# Patient Record
Sex: Female | Born: 1955 | Race: White | Hispanic: No | Marital: Married | Smoking: Former smoker
Health system: Southern US, Community
[De-identification: ages and names within clinical notes are randomized; demographics above are authoritative.]

## PROBLEM LIST (undated history)

## (undated) DIAGNOSIS — Z87891 Personal history of nicotine dependence: Secondary | ICD-10-CM

## (undated) DIAGNOSIS — B9681 Helicobacter pylori [H. pylori] as the cause of diseases classified elsewhere: Secondary | ICD-10-CM

## (undated) DIAGNOSIS — Z9889 Other specified postprocedural states: Secondary | ICD-10-CM

## (undated) DIAGNOSIS — L309 Dermatitis, unspecified: Secondary | ICD-10-CM

## (undated) DIAGNOSIS — J45909 Unspecified asthma, uncomplicated: Secondary | ICD-10-CM

## (undated) DIAGNOSIS — R911 Solitary pulmonary nodule: Secondary | ICD-10-CM

## (undated) DIAGNOSIS — J432 Centrilobular emphysema: Secondary | ICD-10-CM

## (undated) DIAGNOSIS — Z720 Tobacco use: Secondary | ICD-10-CM

## (undated) DIAGNOSIS — R112 Nausea with vomiting, unspecified: Secondary | ICD-10-CM

## (undated) DIAGNOSIS — C4490 Unspecified malignant neoplasm of skin, unspecified: Secondary | ICD-10-CM

## (undated) DIAGNOSIS — M8580 Other specified disorders of bone density and structure, unspecified site: Secondary | ICD-10-CM

## (undated) DIAGNOSIS — K219 Gastro-esophageal reflux disease without esophagitis: Secondary | ICD-10-CM

## (undated) DIAGNOSIS — Z85038 Personal history of other malignant neoplasm of large intestine: Secondary | ICD-10-CM

## (undated) DIAGNOSIS — C189 Malignant neoplasm of colon, unspecified: Secondary | ICD-10-CM

## (undated) DIAGNOSIS — T7840XA Allergy, unspecified, initial encounter: Secondary | ICD-10-CM

## (undated) DIAGNOSIS — B019 Varicella without complication: Secondary | ICD-10-CM

## (undated) HISTORY — DX: Allergy, unspecified, initial encounter: T78.40XA

## (undated) HISTORY — DX: Gastro-esophageal reflux disease without esophagitis: K21.9

## (undated) HISTORY — PX: WISDOM TOOTH EXTRACTION: SHX21

## (undated) HISTORY — DX: Varicella without complication: B01.9

## (undated) HISTORY — PX: INNER EAR SURGERY: SHX679

## (undated) HISTORY — PX: COLECTOMY: SHX59

## (undated) HISTORY — PX: ILEOSTOMY CLOSURE: SHX1784

## (undated) HISTORY — DX: Dermatitis, unspecified: L30.9

## (undated) HISTORY — DX: Malignant neoplasm of colon, unspecified: C18.9

## (undated) HISTORY — DX: Unspecified malignant neoplasm of skin, unspecified: C44.90

## (undated) HISTORY — DX: Other specified disorders of bone density and structure, unspecified site: M85.80

---

## 1998-04-02 HISTORY — PX: UPPER GASTROINTESTINAL ENDOSCOPY: SHX188

## 2006-06-17 ENCOUNTER — Encounter (INDEPENDENT_AMBULATORY_CARE_PROVIDER_SITE_OTHER): Payer: Self-pay | Admitting: Gynecology

## 2006-06-17 ENCOUNTER — Ambulatory Visit: Payer: Self-pay | Admitting: Gynecology

## 2006-09-30 ENCOUNTER — Emergency Department: Payer: Self-pay

## 2007-11-27 ENCOUNTER — Encounter (INDEPENDENT_AMBULATORY_CARE_PROVIDER_SITE_OTHER): Payer: Self-pay | Admitting: Gynecology

## 2007-11-27 ENCOUNTER — Ambulatory Visit: Payer: Self-pay | Admitting: Gynecology

## 2008-04-02 HISTORY — PX: COLONOSCOPY: SHX174

## 2008-12-28 ENCOUNTER — Ambulatory Visit: Payer: Self-pay | Admitting: Obstetrics & Gynecology

## 2008-12-28 ENCOUNTER — Encounter: Payer: Self-pay | Admitting: Obstetrics & Gynecology

## 2010-08-15 NOTE — Assessment & Plan Note (Signed)
NAME:  Crystal Barrett, Crystal Barrett NO.:  [id number]   MEDICAL RECORD NO.:  [id number]          PATIENT TYPE:  POB   LOCATION:  [facility] at [facility]         FACILITY:  [facility]   PHYSICIAN:  Allie Bossier, MD        DATE OF BIRTH:  1955-09-30   DATE OF SERVICE:  12/28/2008                                  CLINIC NOTE   Pansy is a 55 year old, married white, gravida 3, para 3, her children  are ages 59, 93, and 9.  She has 4 grandchildren and 1 on the way.  She  comes here today for her annual exam.  Her complaint today is that of  difficulty sleeping.  She says she takes her 2-3 hours fall asleep and  that she wakes up in the middle of the night.  She has tried Benadryl  and Tylenol PM, these both initially worked, but she grew tolerant of  their effects and now she cannot sleep very well at all.  Her other  complaint is that of a rash on her breast.  She has a history of eczema  and has been using her steroid cream on her breast, but the rash on her  breast has just gotten larger and larger.  She is not yet showing the  breast rash to her dermatologist.   PAST MEDICAL HISTORY:  Eczema and reflux.   PAST SURGICAL HISTORY:  She had her right ear drum replaced.  She has  had postpartum sterilization.   FAMILY HISTORY:  Negative for breast and colon cancer, but a maternal  aunt had ovarian cancer.   ALLERGIES:  CODEINE makes her nauseated.  Allergies to LATEX.   SOCIAL HISTORY:  She quit smoking in 2006, prior to that she has  approximately 25 pack-year history.  She drinks alcohol occasionally.  She drinks 2-3 cups of coffee every morning.   REVIEW OF SYSTEMS:  She is married.  She denies dyspareunia.  This is  her third marriage.  She has been married to this current husband for  last 2 years.  Her last mammogram was done in 2008.  She has not had a  colonoscopy as of yet.   PHYSICAL EXAMINATION:  VITAL SIGNS:  Weight 141, height 5 feet 6 inches,  blood pressure 123/83,  pulse 76.  HEENT:  Normal.  BREASTS:  Normal architecture of the breast.  The skin, however, shows  evidence of a probable tenia infection.  ABDOMEN:  Benign.  EXTERNAL GENITALIA:  No lesions, marked atrophy (the patient denies  dyspareunia).  Cervix nulliparous, no lesions.  Minimal discharge.  Uterus normal size and shape, midplane, nontender.  Adnexa nontender, no  masses.   ASSESSMENT AND PLAN:  Annual exam.  I have checked Pap smear, scheduled  a mammogram.  I am sending her back to her gastroenterologist, so that  she can get a colonoscopy scheduled.  With regard to her breast rash, I  feel this is fungal, and I have recommended either Nizoral shampoo or  Tinactin to her breast.  I have told her if these treatments do not  improve the rash, she goes to her dermatologist.  With regard to her  sleeping difficulty, I have  given her total 60 Ambien to be used as on as necessary basis.  She  understands that I will not refill this for another 12 months, this  gives her approximately 1 pill a week.      Allie Bossier, MD     MCD/MEDQ  D:  12/28/2008  T:  12/29/2008  Job:  [id number]

## 2010-08-15 NOTE — Assessment & Plan Note (Signed)
NAME:  Crystal Barrett, ECKMANN NO.:  [id number]   MEDICAL RECORD NO.:  [id number]          PATIENT TYPE:  POB   LOCATION:  [facility] at [facility]         FACILITY:  [facility]   PHYSICIAN:  Ginger Carne, MD DATE OF BIRTH:  Oct 28, 1955   DATE OF SERVICE:  11/27/2007                                  CLINIC NOTE   Ms. Crystal Barrett is here today for gynecologic evaluation.  Over the course of  a year, she stopped smoking approximately a year and a half ago.  She is  satisfied and continues with her hormone replacement therapy (estradiol  1.0/5 mg daily) and denies cardiac, GI, or GU complaints.  She has not  had any vaginal bleeding.  Her last mammogram was in April 2008 and will  have another one in April 2010.  Her past medical history is  unremarkable and the reader is referred to her chart for further  information.   PHYSICAL EXAMINATION:  VITAL SIGNS:  Salient physical findings, blood  pressure 122/83, weight 148 pounds, height 5 feet 6 inches, pulse 70.  HEENT:  Grossly normal.  BREASTS:  Without masses, discharge, thickenings, or tenderness.  CHEST:  Clear to percussion and auscultation.  CARDIOVASCULAR:  Without murmurs or enlargements.  Regular rate and  rhythm.  EXTREMITY, LYMPHATIC, SKIN, NEUROLOGICAL, AND MUSCULOSKELETAL:  Within  normal limits.  ABDOMEN:  Soft without gross hepatosplenomegaly.  PELVIC:  Pap smear performed.  EXTERNAL GENITALIA:  Vulva and vagina are normal.  Cervix smooth without  erosions or lesions.  Uterus small, anteverted, and flexed and both  adnexa palpable and found to be normal.   IMPRESSION:  Normal gynecologic examination.   PLAN:  The patient will have a mammogram in April 2010.  A complete  metabolic profile, TSH, CBC, and lipid panel were ordered and renewal on  estradiol 1 mg, norethindrone acetate 5 mg daily, and the patient was  asked to return in 1 year for routine followup evaluation.           ______________________________  Ginger Carne, MD     SHB/MEDQ  D:  11/27/2007  T:  11/27/2007  Job:  [id number]

## 2011-06-04 ENCOUNTER — Ambulatory Visit: Payer: Self-pay | Admitting: Gastroenterology

## 2014-01-25 ENCOUNTER — Encounter: Payer: Self-pay | Admitting: Podiatry

## 2014-01-25 ENCOUNTER — Ambulatory Visit (INDEPENDENT_AMBULATORY_CARE_PROVIDER_SITE_OTHER): Payer: BC Managed Care – PPO

## 2014-01-25 ENCOUNTER — Ambulatory Visit (INDEPENDENT_AMBULATORY_CARE_PROVIDER_SITE_OTHER): Payer: BC Managed Care – PPO | Admitting: Podiatry

## 2014-01-25 ENCOUNTER — Other Ambulatory Visit: Payer: Self-pay | Admitting: *Deleted

## 2014-01-25 VITALS — BP 110/75 | HR 77 | Resp 16 | Ht 66.0 in | Wt 130.0 lb

## 2014-01-25 DIAGNOSIS — S93402A Sprain of unspecified ligament of left ankle, initial encounter: Secondary | ICD-10-CM

## 2014-01-25 DIAGNOSIS — M76822 Posterior tibial tendinitis, left leg: Secondary | ICD-10-CM

## 2014-01-25 DIAGNOSIS — M722 Plantar fascial fibromatosis: Secondary | ICD-10-CM

## 2014-01-25 MED ORDER — METHYLPREDNISOLONE (PAK) 4 MG PO TABS: ORAL_TABLET | ORAL | Status: DC

## 2014-01-25 MED ORDER — MELOXICAM 15 MG PO TABS: 15.0000 mg | ORAL_TABLET | Freq: Every day | ORAL | Status: DC

## 2014-01-25 NOTE — Progress Notes (Signed)
   Subjective:    Patient ID: Crystal Barrett, female    DOB: Mar 10, 1956, 58 y.o.   MRN: [id number]  HPI Comments: Labor day week , walking on the beach and the left foot and ankle started to hurt, it bruised and swelled up. It has got better but the heel still hurts , the bottom of heel and around the sides up the back   Foot Pain      Review of Systems  All other systems reviewed and are negative.      Objective:   Physical Exam: I have reviewed her past medical history medications allergies surgery social history and review of systems. Pulses are strongly palpable bilateral. Neurologic sensorium is intact per Semmes-Weinstein monofilament. Deep tendon reflexes are intact bilateral and muscle strength +5 over 5 dorsiflexion plantar flexors inverters and everters on physical musculature is intact. She does have pain on palpation of the posterior tibial tendon as it courses beneath the medial malleolus of the left foot extending to the navicular tuberosity. She also has pain on palpation medial calcaneal tubercle of her left heel. Radiographic evaluation does not demonstrate a fracture to the calcaneus rather a soft tissue increase in density at the plantar fascial calcaneal insertion site indicative of plantar fasciitis.        Assessment & Plan:  Assessment: Plantar fasciitis with compensatory posterior tibial tendinitis left foot.  Plan: Discussed etiology pathology conservative versus surgical therapies. I injected the left heel today with Kenalog and local anesthetic. Prescribed a Medrol Dosepak to be followed by meloxicam. A plantar fascial brace and a night splint to be dispensed. Discussed appropriate shoe gear stretching exercises ice therapy shoe modifications. We discussed the etiology pathology conservative versus surgical therapies. I will follow-up with her in 1 month.

## 2014-02-22 ENCOUNTER — Ambulatory Visit: Payer: BC Managed Care – PPO | Admitting: Podiatry

## 2014-09-29 ENCOUNTER — Ambulatory Visit (INDEPENDENT_AMBULATORY_CARE_PROVIDER_SITE_OTHER): Payer: BLUE CROSS/BLUE SHIELD | Admitting: Obstetrics and Gynecology

## 2014-09-29 ENCOUNTER — Encounter: Payer: Self-pay | Admitting: Obstetrics and Gynecology

## 2014-09-29 VITALS — BP 104/70 | HR 67 | Ht 67.0 in | Wt 128.4 lb

## 2014-09-29 DIAGNOSIS — Z1211 Encounter for screening for malignant neoplasm of colon: Secondary | ICD-10-CM

## 2014-09-29 DIAGNOSIS — G4700 Insomnia, unspecified: Secondary | ICD-10-CM | POA: Insufficient documentation

## 2014-09-29 DIAGNOSIS — Z01419 Encounter for gynecological examination (general) (routine) without abnormal findings: Secondary | ICD-10-CM

## 2014-09-29 DIAGNOSIS — M8580 Other specified disorders of bone density and structure, unspecified site: Secondary | ICD-10-CM | POA: Diagnosis not present

## 2014-09-29 DIAGNOSIS — Z780 Asymptomatic menopausal state: Secondary | ICD-10-CM

## 2014-09-29 DIAGNOSIS — K219 Gastro-esophageal reflux disease without esophagitis: Secondary | ICD-10-CM | POA: Insufficient documentation

## 2014-09-29 DIAGNOSIS — Z1231 Encounter for screening mammogram for malignant neoplasm of breast: Secondary | ICD-10-CM

## 2014-09-29 MED ORDER — OMEPRAZOLE 40 MG PO CPDR: 40.0000 mg | DELAYED_RELEASE_CAPSULE | Freq: Every day | ORAL | Status: DC

## 2014-09-29 MED ORDER — TRAZODONE HCL 150 MG PO TABS: 150.0000 mg | ORAL_TABLET | Freq: Every day | ORAL | Status: DC

## 2014-09-29 NOTE — Progress Notes (Signed)
Patient ID: Crystal Barrett, female   DOB: 12/30/1955, 59 y.o.   MRN: [id number]   ANNUAL PREVENTATIVE CARE GYN  ENCOUNTER NOTE  Subjective:       Crystal MeekDorcas P Eppolito is a 59 y.o. [id number] female here for a routine annual gynecologic exam.  Current complaints: 1.  Establish care.  Patient is a 59 year old married white female, para 3003, menopausal, on no hormone replacement therapy, without vasomotor symptoms, who presents for establishment of prenatal care.  Patient was previously seen at [facility].  Past OB history: Para 3003. SVD 3. No prenatal complications.  Past GYN history: Menarche age 59. Menopause age 59. No history of HRT use. No history of vasomotor symptoms. No history of abnormal Pap smears; last Pap 2014-normal. No history of abnormal mammograms. Recent diagnosis of osteopenia; patient declines bisphosphonates and HRT.  She desires.  Calcium with vitamin D and exercise management.   Gynecologic History No LMP recorded. Patient is postmenopausal. Contraception: post menopausal status Last Pap: 05/02/2012 neg/neg. Results were: normal Last mammogram: 05/29/2013 . Results were: normal  Obstetric History OB History  Gravida Para Term Preterm AB SAB TAB Ectopic Multiple Living  3 3 3       3     # Outcome Date GA Lbr Len/2nd Weight Sex Delivery Anes PTL Lv  3 Term 1990   7 lb (3.175 kg) M VBAC   Y  2 Term 1977   7 lb 1.6 oz (3.221 kg) F VBAC   Y  1 Term 1975   7 lb (3.175 kg) F VBAC   Y      Past Medical History  Diagnosis Date  . Allergy   . Eczema   . Acid reflux   . Osteopenia     Past Surgical History  Procedure Laterality Date  . Inner ear surgery      Current Outpatient Prescriptions on File Prior to Visit  Medication Sig Dispense Refill  . Dermatological Products, Misc. (HPR PLUS) CREA     . fluticasone (FLONASE) 50 MCG/ACT nasal spray     . HALOG 0.1 % CREA     . omeprazole (PRILOSEC) 40 MG capsule     . PATANOL 0.1 % ophthalmic  solution     . traZODone (DESYREL) 150 MG tablet      No current facility-administered medications on file prior to visit.    Allergies  Allergen Reactions  . Codeine Nausea And Vomiting    History   Social History  . Marital Status: Married    Spouse Name: N/A  . Number of Children: N/A  . Years of Education: N/A   Occupational History  . Not on file.   Social History Main Topics  . Smoking status: Never Smoker   . Smokeless tobacco: Never Used  . Alcohol Use: Yes     Comment: occas  . Drug Use: No  . Sexual Activity: Yes    Birth Control/ Protection: None   Other Topics Concern  . Not on file   Social History Narrative    Family History  Problem Relation Age of Onset  . Heart disease Mother   . Breast cancer Neg Hx   . Colon cancer Neg Hx   . Ovarian cancer Maternal Aunt   . Diabetes Daughter     The following portions of the patient's history were reviewed and updated as appropriate: allergies, current medications, past family history, past medical history, past social history, past surgical history and problem list.  Review  of Systems ROS Review of Systems - General ROS: negative for - chills, fatigue, fever, hot flashes, night sweats, weight gain or weight loss Psychological ROS: negative for - anxiety, decreased libido, depression, mood swings, physical abuse or sexual abuse Ophthalmic ROS: negative for - blurry vision, eye pain or loss of vision ENT ROS: negative for - headaches, hearing change, visual changes or vocal changes Allergy and Immunology ROS: negative for - hives, itchy/watery eyes or seasonal allergies Hematological and Lymphatic ROS: negative for - bleeding problems, bruising, swollen lymph nodes or weight loss Endocrine ROS: negative for - galactorrhea, hair pattern changes, hot flashes, malaise/lethargy, mood swings, palpitations, polydipsia/polyuria, skin changes, temperature intolerance or unexpected weight changes Breast ROS: negative  for - new or changing breast lumps or nipple discharge Respiratory ROS: negative for - cough or shortness of breath Cardiovascular ROS: negative for - chest pain, irregular heartbeat, palpitations or shortness of breath Gastrointestinal ROS: no abdominal pain, change in bowel habits, or black or bloody stools Genito-Urinary ROS: no dysuria, trouble voiding, or hematuria Musculoskeletal ROS: negative for - joint pain or joint stiffness Neurological ROS: negative for - bowel and bladder control changes Dermatological ROS: negative for rash and skin lesion changes   Objective:   BP 104/70 mmHg  Pulse 67  Ht  (1.702 m)  Wt 128 lb 6.4 oz (58.242 kg)  BMI 20.11 kg/m2 CONSTITUTIONAL: Well-developed, well-nourished female in no acute distress.  PSYCHIATRIC: Normal mood and affect. Normal behavior. Normal judgment and thought content. NEUROLGIC: Alert and oriented to person, place, and time. Normal muscle tone coordination. No cranial nerve deficit noted. HENT:  Normocephalic, atraumatic, External right and left ear normal. Oropharynx is clear and moist EYES: Conjunctivae and EOM are normal. Pupils are equal, round, and reactive to light. No scleral icterus.  NECK: Normal range of motion, supple, no masses.  Normal thyroid.  SKIN: Skin is warm and dry. No rash noted. Not diaphoretic. No erythema. No pallor. CARDIOVASCULAR: Normal heart rate noted, regular rhythm, no murmur. RESPIRATORY: Clear to auscultation bilaterally. Effort and breath sounds normal, no problems with respiration noted. BREASTS: Symmetric in size. No masses, skin changes, nipple drainage, or lymphadenopathy. ABDOMEN: Soft, normal bowel sounds, no distention noted.  No tenderness, rebound or guarding.  BLADDER: Normal PELVIC:  External Genitalia: Normal  BUS: Normal  Vagina: Normal  Cervix: Normal  Uterus: Normal  Adnexa: Normal  RV: External Exam NormaI, No Rectal Masses and Normal Sphincter tone  MUSCULOSKELETAL:  Normal range of motion. No tenderness.  No cyanosis, clubbing, or edema.  2+ distal pulses. LYMPHATIC: No Axillary, Supraclavicular, or Inguinal Adenopathy.    Assessment:   Annual gynecologic examination 59 y.o. Contraception: post menopausal status Normal BMI Problem List Items Addressed This Visit    None      Plan:  Pap: Not needed Mammogram: Ordered Stool Guaiac Testing:  Ordered Labs: a1c,vit d, fbs,tsh and Lipid 1 Routine preventative health maintenance measures emphasized: Exercise/Diet/Weight control, Tobacco Warnings, Alcohol/Substance use risks and Stress Management Refill trazodone 150 mg daily at bedtime; refill omeprazole 40 mg daily Return to Clinic - [address] [place], CMA   Herold Harms, MD

## 2014-10-01 ENCOUNTER — Telehealth: Payer: Self-pay | Admitting: Obstetrics and Gynecology

## 2014-10-01 NOTE — Telephone Encounter (Signed)
Omeprazole, trazodone was phone to [facility]. Needs to be 90 day instead of 30 day

## 2014-10-08 ENCOUNTER — Other Ambulatory Visit: Payer: Self-pay

## 2014-10-08 MED ORDER — TRAZODONE HCL 150 MG PO TABS: 150.0000 mg | ORAL_TABLET | Freq: Every day | ORAL | Status: DC

## 2014-10-08 MED ORDER — OMEPRAZOLE 40 MG PO CPDR: 40.0000 mg | DELAYED_RELEASE_CAPSULE | Freq: Every day | ORAL | Status: DC

## 2014-10-08 NOTE — Telephone Encounter (Signed)
Rx for 90 day supply for Omeprazole and Trazodone. Pt aware

## 2014-10-11 NOTE — Telephone Encounter (Signed)
This was done.

## 2014-10-12 ENCOUNTER — Other Ambulatory Visit: Payer: BLUE CROSS/BLUE SHIELD

## 2014-10-13 LAB — LIPID PANEL
CHOL/HDL RATIO: 2.4 ratio (ref 0.0–4.4)
Cholesterol, Total: 164 mg/dL (ref 100–199)
HDL: 69 mg/dL (ref >39)
LDL Calculated: 83 mg/dL (ref 0–99)
TRIGLYCERIDES: 62 mg/dL (ref 0–149)
VLDL Cholesterol Cal: 12 mg/dL (ref 5–40)

## 2014-10-13 LAB — GLUCOSE, RANDOM: Glucose: 88 mg/dL (ref 65–99)

## 2014-10-13 LAB — TSH: TSH: 3.38 u[IU]/mL (ref 0.450–4.500)

## 2014-10-13 LAB — HEMOGLOBIN A1C
Est. average glucose Bld gHb Est-mCnc: 114 mg/dL
Hgb A1c MFr Bld: 5.6 % (ref 4.8–5.6)

## 2014-10-17 LAB — VITAMIN D 1,25 DIHYDROXY
Vitamin D 1, 25 (OH)2 Total: 36 pg/mL
Vitamin D3 1, 25 (OH)2: 36 pg/mL

## 2014-10-22 LAB — FECAL OCCULT BLOOD, IMMUNOCHEMICAL: Fecal Occult Bld: NEGATIVE

## 2015-01-31 ENCOUNTER — Encounter: Payer: Self-pay | Admitting: Family Medicine

## 2015-02-01 ENCOUNTER — Encounter: Payer: Self-pay | Admitting: Family Medicine

## 2015-02-21 ENCOUNTER — Ambulatory Visit: Payer: Self-pay | Admitting: Family Medicine

## 2015-04-06 ENCOUNTER — Encounter (INDEPENDENT_AMBULATORY_CARE_PROVIDER_SITE_OTHER): Payer: Self-pay

## 2015-04-06 ENCOUNTER — Encounter: Payer: Self-pay | Admitting: Internal Medicine

## 2015-04-06 ENCOUNTER — Ambulatory Visit (INDEPENDENT_AMBULATORY_CARE_PROVIDER_SITE_OTHER): Payer: BLUE CROSS/BLUE SHIELD | Admitting: Internal Medicine

## 2015-04-06 VITALS — BP 128/78 | HR 82 | Temp 98.1°F | Resp 12 | Ht 65.25 in | Wt 129.5 lb

## 2015-04-06 DIAGNOSIS — L309 Dermatitis, unspecified: Secondary | ICD-10-CM

## 2015-04-06 DIAGNOSIS — G4700 Insomnia, unspecified: Secondary | ICD-10-CM

## 2015-04-06 DIAGNOSIS — M8580 Other specified disorders of bone density and structure, unspecified site: Secondary | ICD-10-CM

## 2015-04-06 DIAGNOSIS — Z23 Encounter for immunization: Secondary | ICD-10-CM

## 2015-04-06 DIAGNOSIS — L03012 Cellulitis of left finger: Secondary | ICD-10-CM

## 2015-04-06 DIAGNOSIS — F5105 Insomnia due to other mental disorder: Secondary | ICD-10-CM

## 2015-04-06 DIAGNOSIS — J4520 Mild intermittent asthma, uncomplicated: Secondary | ICD-10-CM

## 2015-04-06 DIAGNOSIS — R0602 Shortness of breath: Secondary | ICD-10-CM

## 2015-04-06 DIAGNOSIS — J45909 Unspecified asthma, uncomplicated: Secondary | ICD-10-CM | POA: Insufficient documentation

## 2015-04-06 DIAGNOSIS — F419 Anxiety disorder, unspecified: Secondary | ICD-10-CM

## 2015-04-06 DIAGNOSIS — IMO0001 Reserved for inherently not codable concepts without codable children: Secondary | ICD-10-CM

## 2015-04-06 MED ORDER — CEPHALEXIN 500 MG PO CAPS: 500.0000 mg | ORAL_CAPSULE | Freq: Four times a day (QID) | ORAL | Status: DC

## 2015-04-06 MED ORDER — HYDROXYZINE HCL 25 MG PO TABS: 25.0000 mg | ORAL_TABLET | Freq: Three times a day (TID) | ORAL | Status: DC | PRN

## 2015-04-06 NOTE — Patient Instructions (Addendum)
Goal IS  1,000 IUS of D3 daily.  Ok to go up to 2, 000  Goal IS 1200 to 18000 mg calcium  Through diet and supplements, (max 1 supplement daily )  Your swollen cuticle looks like paronychia.  I am treateing your foe infection with cephalexin .  Take one capsule 3 times daily for 7 days  Continue to  Halog ointment but cover your hands with either saran wrap of cotton gloves to incresae the contact with sikin to all night long  Hydroxizine up to thee times daily for the itching   Wear gloves whenever possible  Chest x ray and pulmonary function test to evaluate you for COPD  You do not need appoinment for the chest  X ray .,  the office will set up the PFTS for you   Try reading an "old school book" at bedtime instead of the [place].  If the hydroxyzine doesn't help you sleep better,  E mail me for an alternative
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
## 2015-04-06 NOTE — Progress Notes (Signed)
Pre-visit discussion using our clinic review tool. No additional management support is needed unless otherwise documented below in the visit note.  

## 2015-04-09 ENCOUNTER — Encounter: Payer: Self-pay | Admitting: Internal Medicine

## 2015-04-09 DIAGNOSIS — L309 Dermatitis, unspecified: Secondary | ICD-10-CM | POA: Insufficient documentation

## 2015-04-09 NOTE — Assessment & Plan Note (Signed)
Recommended use of saran wrap vs gloves at night to increase period of contact with Halog.

## 2015-04-09 NOTE — Assessment & Plan Note (Signed)
Chronic,  Reviewed principles of good sleep hygiene. Trial of hydroxyzine.

## 2015-04-09 NOTE — Assessment & Plan Note (Addendum)
Moderate, by 2012 [facility]. Discussed the current controversies surrounding the risks and benefits of calcium supplementation.  Encouraged her to increase dietary calcium through natural foods including almond/coconut milk. Repeat DEXA will be discussed at next CPE.

## 2015-04-09 NOTE — Assessment & Plan Note (Signed)
Keflex prescribed.

## 2015-04-09 NOTE — Assessment & Plan Note (Signed)
She has a history of asthma since childhood,  But also smoked for several decades.  NO prior {PFTSs, no recent chest x ray  Need to rule out COPD and CAD

## 2015-04-12 ENCOUNTER — Ambulatory Visit
Admission: RE | Admit: 2015-04-12 | Discharge: 2015-04-12 | Disposition: A | Discharge status: Home / Self Care | Payer: BLUE CROSS/BLUE SHIELD | Source: Ambulatory Visit | Attending: Internal Medicine | Admitting: Internal Medicine

## 2015-04-12 DIAGNOSIS — J4520 Mild intermittent asthma, uncomplicated: Secondary | ICD-10-CM

## 2015-04-12 DIAGNOSIS — Z87891 Personal history of nicotine dependence: Secondary | ICD-10-CM | POA: Insufficient documentation

## 2015-04-12 DIAGNOSIS — R0602 Shortness of breath: Secondary | ICD-10-CM | POA: Diagnosis present

## 2015-04-12 DIAGNOSIS — J42 Unspecified chronic bronchitis: Secondary | ICD-10-CM | POA: Diagnosis not present

## 2015-04-19 ENCOUNTER — Other Ambulatory Visit: Payer: Self-pay | Admitting: Internal Medicine

## 2015-04-19 DIAGNOSIS — R0600 Dyspnea, unspecified: Secondary | ICD-10-CM

## 2015-04-20 ENCOUNTER — Ambulatory Visit (INDEPENDENT_AMBULATORY_CARE_PROVIDER_SITE_OTHER): Payer: BLUE CROSS/BLUE SHIELD | Admitting: *Deleted

## 2015-04-20 DIAGNOSIS — R0600 Dyspnea, unspecified: Secondary | ICD-10-CM | POA: Diagnosis not present

## 2015-04-20 LAB — PULMONARY FUNCTION TEST
DL/VA % pred: 78 %
DL/VA: 3.87 ml/min/mmHg/L
DLCO UNC: 31.47 ml/min/mmHg
DLCO unc % pred: 122 %
FEF 25-75 POST: 0.99 L/s
FEF 25-75 Pre: 1.23 L/sec
FEF2575-%CHANGE-POST: -19 %
FEF2575-%PRED-PRE: 50 %
FEF2575-%Pred-Post: 40 %
FEV1-%CHANGE-POST: -8 %
FEV1-%PRED-POST: 57 %
FEV1-%PRED-PRE: 62 %
FEV1-POST: 1.52 L
FEV1-PRE: 1.67 L
FEV1FVC-%CHANGE-POST: -10 %
FEV1FVC-%Pred-Pre: 64 %
FEV6-%Change-Post: 1 %
FEV6-%PRED-PRE: 100 %
FEV6-%Pred-Post: 101 %
FEV6-Post: 3.37 L
FEV6-Pre: 3.34 L
FEV6FVC-%Change-Post: 0 %
FEV6FVC-%Pred-Post: 102 %
FEV6FVC-%Pred-Pre: 103 %
FVC-%Change-Post: 1 %
FVC-%PRED-PRE: 96 %
FVC-%Pred-Post: 98 %
FVC-POST: 3.39 L
FVC-PRE: 3.34 L
POST FEV6/FVC RATIO: 100 %
PRE FEV1/FVC RATIO: 50 %
PRE FEV6/FVC RATIO: 100 %
Post FEV1/FVC ratio: 45 %

## 2015-04-20 NOTE — Progress Notes (Signed)
PFT performed today with a Nitrogen washout. 

## 2015-04-22 ENCOUNTER — Telehealth: Payer: Self-pay | Admitting: *Deleted

## 2015-04-22 ENCOUNTER — Telehealth: Payer: Self-pay | Admitting: Internal Medicine

## 2015-04-22 DIAGNOSIS — J432 Centrilobular emphysema: Secondary | ICD-10-CM

## 2015-04-22 DIAGNOSIS — J439 Emphysema, unspecified: Secondary | ICD-10-CM

## 2015-04-22 NOTE — Telephone Encounter (Signed)
Spoke with Lynden Ang at Dr. Darrick Huntsman office and gave results of PFT per KK. Pt has Moderate Obstructive Lung Disease with evidence of air trapping. Recommends: pulmonary referral. Informed we could give appt if needed. States she would call back once provider informed and wanted to do referral. Nothing further needed.

## 2015-04-22 NOTE — Telephone Encounter (Signed)
.  Referral is in process as requested to [facility] . plase notify apteitn tht her PFTS were abnormal and were diagnostic of COPD

## 2015-04-22 NOTE — Telephone Encounter (Signed)
Dr. Belia Heman stated there is evidence of obstructive lung disease and evidence of air trapping. Recommends Pulmonary referral. Call misty at this number with referral [phone]

## 2015-04-22 NOTE — Telephone Encounter (Signed)
Left message to return call to office.

## 2015-04-25 ENCOUNTER — Telehealth: Payer: Self-pay | Admitting: Internal Medicine

## 2015-04-25 MED ORDER — ZOLPIDEM TARTRATE 5 MG PO TABS: 5.0000 mg | ORAL_TABLET | Freq: Every evening | ORAL | Status: DC | PRN

## 2015-04-25 NOTE — Telephone Encounter (Signed)
Ok we can try generic ambien 5 mg daily rx printed

## 2015-04-25 NOTE — Telephone Encounter (Signed)
Is she taking the trazodone that is in her chart? That's a sleep medication

## 2015-04-25 NOTE — Telephone Encounter (Signed)
Pt states that yes she has been taking the trazadone but it not working, states that she has been on this medication for years and would like something stronger. Please advise

## 2015-04-25 NOTE — Telephone Encounter (Signed)
Please advise 

## 2015-04-25 NOTE — Telephone Encounter (Signed)
Patient notified and voiced understanding.

## 2015-04-25 NOTE — Telephone Encounter (Signed)
Patient called and wants to know if Dr. Darrick Huntsman can give her a rx to help her sleep. She states that the hydroxyzine hcl 25 doesn't help with sleep, only with her itching, plus it makes her stomach hurt. Please advise.

## 2015-05-06 ENCOUNTER — Ambulatory Visit (INDEPENDENT_AMBULATORY_CARE_PROVIDER_SITE_OTHER): Payer: BLUE CROSS/BLUE SHIELD | Admitting: Pulmonary Disease

## 2015-05-06 ENCOUNTER — Encounter: Payer: Self-pay | Admitting: Pulmonary Disease

## 2015-05-06 VITALS — BP 122/62 | HR 66 | Ht 66.0 in | Wt 130.4 lb

## 2015-05-06 DIAGNOSIS — J449 Chronic obstructive pulmonary disease, unspecified: Secondary | ICD-10-CM

## 2015-05-06 DIAGNOSIS — Z23 Encounter for immunization: Secondary | ICD-10-CM | POA: Diagnosis not present

## 2015-05-06 DIAGNOSIS — J45909 Unspecified asthma, uncomplicated: Secondary | ICD-10-CM

## 2015-05-06 MED ORDER — FLUTICASONE FUROATE-VILANTEROL 100-25 MCG/INH IN AEPB: 1.0000 | INHALATION_SPRAY | Freq: Every day | RESPIRATORY_TRACT | Status: AC

## 2015-05-06 MED ORDER — FLUTICASONE FUROATE-VILANTEROL 100-25 MCG/INH IN AEPB: 1.0000 | INHALATION_SPRAY | Freq: Every day | RESPIRATORY_TRACT | Status: DC

## 2015-05-08 NOTE — Progress Notes (Signed)
PULMONARY CONSULT NOTE  Requesting MD/Service: Darrick Huntsman, MD Date of initial consultation: 05/06/15 Reason for consultation: New dx of COPD  PT PROFILE: 60 y.o. F former smoker diagnosed with emphysema based on PFTs, CXR. Pt is minimally symptomatic on initial evaluation  HPI:  9 F who usually enjoys good health and remains active. She has a history of asthma as a child which she "outgrew" but which then relapsed at approx age 57. She quit smoking in 2008 and reports that her asthma again resolved. She indicates that she underwent a routine evaluation by Dr Darrick Huntsman who noted an abnormality on chest exam that was followed by CXR and PFTs, discussed below. She is referred for further evaluation. She exercises regularly on a treadmill. Initially, she indicated that her exercise tolerance is normal but on further questioning, admits that she is mildly limited by dyspnea. There is little to no day to day or seasonal variability. She denies chest pain, orthopnea, PND, cough, sputum production, hemoptysis, LE edema.    Past Medical History  Diagnosis Date  . Allergy   . Eczema   . Acid reflux   . Osteopenia   . Chicken pox     Past Surgical History  Procedure Laterality Date  . Inner ear surgery      MEDICATIONS: I have reviewed all medications and confirmed regimen as documented  Social History   Social History  . Marital Status: Married    Spouse Name: N/A  . Number of Children: N/A  . Years of Education: N/A   Occupational History  . Not on file.   Social History Main Topics  . Smoking status: Former Smoker -- 0.50 packs/day    Types: Cigarettes    Quit date: 07/05/2006  . Smokeless tobacco: Never Used  . Alcohol Use: 0.0 oz/week    0 Standard drinks or equivalent per week     Comment: occas  . Drug Use: No  . Sexual Activity: Yes    Birth Control/ Protection: None   Other Topics Concern  . Not on file   Social History Narrative    Family History  Problem Relation Age  of Onset  . Heart disease Mother   . Breast cancer Neg Hx   . Colon cancer Neg Hx   . Ovarian cancer Maternal Aunt   . Diabetes Daughter     ROS: No fever, myalgias/arthralgias, unexplained weight loss or weight gain No new focal weakness or sensory deficits No otalgia, hearing loss, visual changes, nasal and sinus symptoms, mouth and throat problems No neck pain or adenopathy No abdominal pain, N/V/D, diarrhea, change in bowel pattern No dysuria, change in urinary pattern No LE edema or calf tenderness   Filed Vitals:   05/06/15 1425  BP: 122/62  Pulse: 66  Height:  (1.676 m)  Weight: 130 lb 6.4 oz (59.149 kg)  SpO2: 95%     EXAM:  Gen: WDWN, No overt respiratory distress HEENT: NCAT, TMs and canals normal, oropharynx normal Neck: Supple without LAN, thyromegaly, JVD Lungs: breath sounds mildly diminished, percussion note normal, No wheezes Cardiovascular: Normal rate, reg rhythm, no murmurs noted Abdomen: Soft, nontender, normal BS Ext: without clubbing, cyanosis, edema Neuro: CNs grossly intact, motor and sensory intact, DTRs symmetric Skin: Limited exam, no lesions noted  DATA:   BMP Latest Ref Rng 10/12/2014  Glucose 65 - 99 mg/dL 88    No flowsheet data found.  CXR (04/12/15): [facility]  PFTs  (04/20/15): Mild to moderate obstruction (FEV1 62% pred),  no significant improvement with bronchodilator, normal lung volumes, normal DLCO  IMPRESSION:     ICD-9-CM ICD-10-CM   1. Asthma, unspecified asthma severity, uncomplicated 493.90 J45.909   2. Chronic obstructive pulmonary disease, unspecified COPD type (HCC) 496 J44.9   3. Need for prophylactic vaccination against Streptococcus pneumoniae (pneumococcus) V03.82 Z23 CANCELED: Pneumococcal polysaccharide vaccine 23-valent greater than or equal to 2yo subcutaneous/IM  1) H/O asthma 2) COPD - mild obstruction. Little evidence of reversibility.  3) Former smoker   PLAN:  1) we discussed the diagnosis of  COPD, its implications and expected course. I conveyed that she has already undertaken the most improtant aspects of management inculding smoking cessation, weight management and regular exercise 2) trial of Breo inhaler 3) Pneumovax administered on day of this encounter 4) ROV 6-8 wks to assess response to [facility]. If no symptomatic improvement, consider trial of Incruse.  5) She is a good candidate for LDCT screening and this will be discussed on future visits   Merwyn Katos, MD [facility], Critical Care Medicine

## 2015-06-27 ENCOUNTER — Ambulatory Visit: Payer: BLUE CROSS/BLUE SHIELD | Admitting: Pulmonary Disease

## 2015-07-01 ENCOUNTER — Other Ambulatory Visit: Payer: Self-pay | Admitting: Internal Medicine

## 2015-07-01 MED ORDER — FLUTICASONE PROPIONATE 50 MCG/ACT NA SUSP: 2.0000 | Freq: Every day | NASAL | Status: DC

## 2015-07-01 NOTE — Telephone Encounter (Signed)
Medication was a historical medication. Please advise?

## 2015-07-01 NOTE — Telephone Encounter (Signed)
REFILLED

## 2015-09-23 ENCOUNTER — Telehealth: Payer: Self-pay | Admitting: *Deleted

## 2015-09-23 DIAGNOSIS — Z1239 Encounter for other screening for malignant neoplasm of breast: Secondary | ICD-10-CM

## 2015-09-23 NOTE — Telephone Encounter (Signed)
Patient called and states that it is time for her mammogram and she wants to know if Dr. Tommi Rumpse can put the order in so she can get it done before her annual in Oct. . Patient is requesting a call back Her number is [phone]. Thanks

## 2015-09-23 NOTE — Telephone Encounter (Signed)
Pt aware mammo ordered. Given number to [place].

## 2015-10-05 ENCOUNTER — Ambulatory Visit: Payer: BLUE CROSS/BLUE SHIELD | Admitting: Internal Medicine

## 2015-10-11 ENCOUNTER — Encounter: Payer: BLUE CROSS/BLUE SHIELD | Admitting: Obstetrics and Gynecology

## 2015-11-03 ENCOUNTER — Other Ambulatory Visit: Payer: Self-pay | Admitting: *Deleted

## 2015-11-03 MED ORDER — FLUTICASONE FUROATE-VILANTEROL 100-25 MCG/INH IN AEPB: 1.0000 | INHALATION_SPRAY | Freq: Every day | RESPIRATORY_TRACT | 0 refills | Status: DC

## 2015-11-04 ENCOUNTER — Ambulatory Visit: Payer: BLUE CROSS/BLUE SHIELD | Admitting: Internal Medicine

## 2015-11-23 ENCOUNTER — Other Ambulatory Visit: Payer: Self-pay | Admitting: Obstetrics and Gynecology

## 2015-12-12 ENCOUNTER — Ambulatory Visit (INDEPENDENT_AMBULATORY_CARE_PROVIDER_SITE_OTHER): Payer: BLUE CROSS/BLUE SHIELD | Admitting: Internal Medicine

## 2015-12-12 ENCOUNTER — Encounter: Payer: Self-pay | Admitting: Internal Medicine

## 2015-12-12 DIAGNOSIS — L309 Dermatitis, unspecified: Secondary | ICD-10-CM | POA: Diagnosis not present

## 2015-12-12 MED ORDER — COAL TAR EXTRACT 2.5 % EX EMUL: CUTANEOUS | 3 refills | Status: DC

## 2015-12-12 NOTE — Patient Instructions (Addendum)
I think the diagnosis of eczema is correct but the regimen should be changed;  1) Use Neutrogena  [place] Formula Emulsion hand cream three times daily  2) Soak heel in tar oil called Balnetar for 15 mintues,  Then apply ceravie/clobetasol to heel and wrap the heel in saran wrap for the night   3) Apply the ceravie two more times during the day

## 2015-12-12 NOTE — Progress Notes (Signed)
Subjective:  Patient ID: Crystal Barrett, female    DOB: [birth year]  Age: 60 y.o. MRN: [id number]  CC: The encounter diagnosis was Eczema.  HPIroDorcas P Bodine presents for persistent eczema of left heel.  Has been treating it since February with no improvement.  Has tried 3 creams , heel feels irritated and skin peels.  Using mineral oil   Most recent cream prescribed for the heel as Ceravie mixed with clobetasol 0.05% solution and has been using it on body but it has not healed the cracked and peeling skin  of her  heel.  Previous triial of halcinonide  Didn't work either  .  Has been wearing open healed shoes, keepig foot dry.     Outpatient Medications Prior to Visit  Medication Sig Dispense Refill  . Dermatological Products, Misc. (HPR PLUS) CREA     . fluticasone furoate-vilanterol (BREO ELLIPTA) 100-25 MCG/INH AEPB Inhale 1 puff into the lungs daily. MUST BE SEEN BEFORE ANY FURTHER REFILLS. 180 each 0  . HALOG 0.1 % CREA     . loteprednol (LOTEMAX) 0.5 % ophthalmic suspension Place 1 drop into both eyes 2 (two) times daily.    Marland Kitchen omeprazole (PRILOSEC) 40 MG capsule TAKE 1 CAPSULE DAILY 60 capsule 1  . traZODone (DESYREL) 150 MG tablet TAKE 1 TABLET AT BEDTIME 60 tablet 0  . fluticasone (FLONASE) 50 MCG/ACT nasal spray Place 2 sprays into both nostrils daily. (Patient not taking: Reported on 12/12/2015) 16 g 3  . zolpidem (AMBIEN) 5 MG tablet Take 1 tablet (5 mg total) by mouth at bedtime as needed for sleep. (Patient not taking: Reported on 12/12/2015) 30 tablet 0  . cephALEXin (KEFLEX) 500 MG capsule Take 1 capsule (500 mg total) by mouth 4 (four) times daily. 21 capsule 0  . hydrOXYzine (ATARAX/VISTARIL) 25 MG tablet Take 1 tablet (25 mg total) by mouth 3 (three) times daily as needed. 90 tablet 0   No facility-administered medications prior to visit.     Review of Systems;  Patient denies headache, fevers, malaise, unintentional weight loss, skin rash, eye pain, sinus  congestion and sinus pain, sore throat, dysphagia,  hemoptysis , cough, dyspnea, wheezing, chest pain, palpitations, orthopnea, edema, abdominal pain, nausea, melena, diarrhea, constipation, flank pain, dysuria, hematuria, urinary  Frequency, nocturia, numbness, tingling, seizures,  Focal weakness, Loss of consciousness,  Tremor, insomnia, depression, anxiety, and suicidal ideation.      Objective:  BP 122/84 (BP Location: Right Arm, Patient Position: Sitting, Cuff Size: Normal)   Pulse (!) 150   Temp 97 F (36.1 C) (Oral)   Resp 16   Ht 5\' 5"  (1.651 m)   Wt 136 lb (61.7 kg)   SpO2 97%   BMI 22.63 kg/m   BP Readings from Last 3 Encounters:  12/12/15 122/84  05/06/15 122/62  04/06/15 128/78    Wt Readings from Last 3 Encounters:  12/12/15 136 lb (61.7 kg)  05/06/15 130 lb 6.4 oz (59.1 kg)  04/06/15 129 lb 8 oz (58.7 kg)    General appearance: alert, cooperative and appears stated age Ears: normal TM's and external ear canals both ears Throat: lips, mucosa, and tongue normal; teeth and gums normal Neck: no adenopathy, no carotid bruit, supple, symmetrical, trachea midline and thyroid not enlarged, symmetric, no tenderness/mass/nodules Back: symmetric, no curvature. ROM normal. No CVA tenderness. Lungs: clear to auscultation bilaterally Heart: regular rate and rhythm, S1, S2 normal, no murmur, click, rub or gallop Abdomen: soft, non-tender; bowel sounds normal; no  masses,  no organomegaly Pulses: 2+ and symmetric Skin: left heel with peeling crackiing skin and eczematous rash  Lymph nodes: Cervical, supraclavicular, and axillary nodes normal.  Lab Results  Component Value Date   HGBA1C 5.6 10/12/2014    No results found for: CREATININE  Lab Results  Component Value Date   GLUCOSE 88 10/12/2014   CHOL 164 10/12/2014   TRIG 62 10/12/2014   HDL 69 10/12/2014   LDLCALC 83 10/12/2014   TSH 3.380 10/12/2014   HGBA1C 5.6 10/12/2014    Dg Chest 2 View  Result Date:  04/12/2015 CLINICAL DATA:  URI symptoms; possible chronic bronchitis, known history of asthma and previous tobacco use; abnormal chest exam. EXAM: CHEST  2 VIEW COMPARISON:  None in PACs FINDINGS: The lungs are well-expanded. There is no hemidiaphragm flattening. The heart and pulmonary vascularity are normal. The mediastinum is normal in width. There is no pleural effusion. There is mild anterior wedging of a mid thoracic vertebral body. IMPRESSION: Mild hyperinflation is consistent with acute and/or chronic bronchitis-asthma. There is no alveolar pneumonia nor CHF. Electronically Signed   By: David  SwazilandJordan M.D.   On: 04/12/2015 08:21    Assessment & Plan:   Problem List Items Addressed This Visit    Eczema    Patient has seen Dermatology three times with no improvement in eczema.  New treatment plan involves application of emulsifier  tar soaks,  And steroid  cream       Other Visit Diagnoses   None.     I have discontinued Ms. Mohamad's hydrOXYzine and cephALEXin. I am also having her start on [employer] Sachs[employer]al Tar Extract. Additionally, I am having her maintain her HPR PLUS, HALOG, loteprednol, zolpidem, fluticasone, fluticasone furoate-vilanterol, traZODone, and omeprazole.  Meds ordered this encounter  Medications  . Coal Tar Extract 2.5 % EMUL    Sig: Use nightly to siak heel for 15 minutes    Dispense:  221 mL    Refill:  3    Medications Discontinued During This Encounter  Medication Reason  . cephALEXin (KEFLEX) 500 MG capsule One time medication  . hydrOXYzine (ATARAX/VISTARIL) 25 MG tablet Patient has not taken in last 30 days    Follow-up: No Follow-up on file.   Sherlene ShamsULLO, Cable Fearn L, MD

## 2015-12-13 NOTE — Progress Notes (Signed)
Pre visit review using our clinic review tool, if applicable. No additional management support is needed unless otherwise documented below in the visit note. 

## 2015-12-14 NOTE — Assessment & Plan Note (Signed)
Patient has seen Dermatology three times with no improvement in eczema.  New treatment plan involves application of emulsifier  tar soaks,  And steroid  cream

## 2016-01-09 NOTE — Progress Notes (Signed)
ANNUAL PREVENTATIVE CARE GYN  ENCOUNTER NOTE  Subjective:       Crystal Barrett is a 60 y.o. [id number] female here for a routine annual gynecologic exam.  Current complaints: 1.  Vaginal dryness with dyspareunia  Patient is not interested in systemic hormone replacement therapy. She is willing to go on a trial of vaginal estrogen.  Bowel and bladder function are normal.  Mammogram has already been completed with results pending.   Gynecologic History No LMP recorded. Patient is postmenopausal. Contraception: post menopausal status Last [facility]: [facility] Results were: normal Last mammogram:  01/10/2016- waiting on results. Results were:  Menarche age 60. Menopause age 60. No history of HRT use. No history of vasomotor symptoms. No history of abnormal Pap smears; last Pap 2014-normal. No history of abnormal mammograms. Recent diagnosis of osteopenia; patient declines bisphosphonates and HRT.  She desires.  Calcium with vitamin D and exercise management.    Obstetric History OB History  Gravida Para Term Preterm AB Living  3 3 3     3   SAB TAB Ectopic Multiple Live Births          3    # Outcome Date GA Lbr Len/2nd Weight Sex Delivery Anes PTL Lv  3 Term 1990   7 lb (3.175 kg) M VBAC   LIV  2 Term 1977   7 lb 1.6 oz (3.221 kg) F VBAC   LIV  1 Term 1975   7 lb (3.175 kg) F VBAC   LIV      Past Medical History:  Diagnosis Date  . Acid reflux   . Allergy   . Chicken pox   . Eczema   . Osteopenia     Past Surgical History:  Procedure Laterality Date  . INNER EAR SURGERY      Current Outpatient Prescriptions on File Prior to Visit  Medication Sig Dispense Refill  . Coal Tar Extract 2.5 % EMUL Use nightly to siak heel for 15 minutes 221 mL 3  . Dermatological Products, Misc. (HPR PLUS) CREA     . fluticasone (FLONASE) 50 MCG/ACT nasal spray Place 2 sprays into both nostrils daily. (Patient not taking: Reported on 12/12/2015) 16 g 3  . fluticasone  furoate-vilanterol (BREO ELLIPTA) 100-25 MCG/INH AEPB Inhale 1 puff into the lungs daily. MUST BE SEEN BEFORE ANY FURTHER REFILLS. 180 each 0  . HALOG 0.1 % CREA     . loteprednol (LOTEMAX) 0.5 % ophthalmic suspension Place 1 drop into both eyes 2 (two) times daily.    Marland Kitchen. omeprazole (PRILOSEC) 40 MG capsule TAKE 1 CAPSULE DAILY 60 capsule 1  . traZODone (DESYREL) 150 MG tablet TAKE 1 TABLET AT BEDTIME 60 tablet 0  . zolpidem (AMBIEN) 5 MG tablet Take 1 tablet (5 mg total) by mouth at bedtime as needed for sleep. (Patient not taking: Reported on 12/12/2015) 30 tablet 0   No current facility-administered medications on file prior to visit.     Allergies  Allergen Reactions  . Codeine Nausea And Vomiting    Social History   Social History  . Marital status: Married    Spouse name: N/A  . Number of children: N/A  . Years of education: N/A   Occupational History  . Not on file.   Social History Main Topics  . Smoking status: Former Smoker    Packs/day: 0.50    Types: Cigarettes    Quit date: 07/05/2006  . Smokeless tobacco: Never Used  . Alcohol  use 0.0 oz/week     Comment: occas  . Drug use: No  . Sexual activity: Yes    Birth control/ protection: None   Other Topics Concern  . Not on file   Social History Narrative  . No narrative on file    Family History  Problem Relation Age of Onset  . Heart disease Mother   . Ovarian cancer Maternal Aunt   . Diabetes Daughter   . Breast cancer Neg Hx   . Colon cancer Neg Hx     The following portions of the patient's history were reviewed and updated as appropriate: allergies, current medications, past family history, past medical history, past social history, past surgical history and problem list.  Review of Systems ROS Review of Systems - General ROS: negative for - chills, fatigue, fever, hot flashes, night sweats, weight gain or weight loss Psychological ROS: negative for - anxiety, decreased libido, depression, mood  swings, physical abuse or sexual abuse Ophthalmic ROS: negative for - blurry vision, eye pain or loss of vision ENT ROS: negative for - headaches, hearing change, visual changes or vocal changes Allergy and Immunology ROS: negative for - hives, itchy/watery eyes or seasonal allergies Hematological and Lymphatic ROS: negative for - bleeding problems, bruising, swollen lymph nodes or weight loss Endocrine ROS: negative for - galactorrhea, hair pattern changes, hot flashes, malaise/lethargy, mood swings, palpitations, polydipsia/polyuria, skin changes, temperature intolerance or unexpected weight changes Breast ROS: negative for - new or changing breast lumps or nipple discharge Respiratory ROS: negative for - cough or shortness of breath Cardiovascular ROS: negative for - chest pain, irregular heartbeat, palpitations or shortness of breath Gastrointestinal ROS: no abdominal pain, change in bowel habits, or black or bloody stools Genito-Urinary ROS: no dysuria, trouble voiding, or hematuria Musculoskeletal ROS: negative for - joint pain or joint stiffness Neurological ROS: negative for - bowel and bladder control changes Dermatological ROS: negative for rash and skin lesion changes   Objective:   BP 99/68   Pulse 80   Ht 5\' 5"  (1.651 m)   Wt 136 lb 3.2 oz (61.8 kg)   BMI 22.66 kg/m  CONSTITUTIONAL: Well-developed, well-nourished female in no acute distress.  PSYCHIATRIC: Normal mood and affect. Normal behavior. Normal judgment and thought content. NEUROLGIC: Alert and oriented to person, place, and time. Normal muscle tone coordination. No cranial nerve deficit noted. HENT:  Normocephalic, atraumatic, External right and left ear normal. Oropharynx is clear and moist EYES: Conjunctivae and EOM are normal. Pupils are equal, round, and reactive to light. No scleral icterus.  NECK: Normal range of motion, supple, no masses.  Normal thyroid.  SKIN: Skin is warm and dry. No rash noted. Not  diaphoretic. No erythema. No pallor. CARDIOVASCULAR: Normal heart rate noted, regular rhythm, no murmur. RESPIRATORY: Clear to auscultation bilaterally. Effort and breath sounds normal, no problems with respiration noted. BREASTS: Symmetric in size. No masses, skin changes, nipple drainage, or lymphadenopathy. ABDOMEN: Soft, normal bowel sounds, no distention noted.  No tenderness, rebound or guarding.  BLADDER: Normal PELVIC:  External Genitalia: Atrophic changes  BUS: Small urethral caruncle  Vagina: Moderate vaginal atrophy  Cervix: Normal; no lesions  Uterus: Normal; small midplane mobile and nontender  Adnexa: Normal; nonpalpable and nontender  RV: External Exam NormaI, No Rectal Masses and Normal Sphincter tone  MUSCULOSKELETAL: Normal range of motion. No tenderness.  No cyanosis, clubbing, or edema.  2+ distal pulses. LYMPHATIC: No Axillary, Supraclavicular, or Inguinal Adenopathy.    Assessment:   Annual gynecologic  examination 60 y.o. Contraception: post menopausal status Normal BMI Problem List Items Addressed This Visit    Menopause   Osteopenia    Other Visit Diagnoses    Well woman exam with routine gynecological exam    -  Primary   Encounter for screening mammogram for breast cancer       Screening for colon cancer          Plan:  Pap: Pap Co Test Mammogram: utd done- 01/10/2016 Stool Guaiac Testing:  Ordered Labs: Will check with insurance and call back if desires Routine preventative health maintenance measures emphasized: Exercise/Diet/Weight control, Tobacco Warnings and Alcohol/Substance use risks Trial of Premarin cream intravaginal twice a week Return to Clinic - 1 Year   Brandi Armato [place], CMA  Herold Harms, MD  Note: This dictation was prepared with Dragon dictation along with smaller phrase technology. Any transcriptional errors that result from this process are unintentional.

## 2016-01-10 ENCOUNTER — Encounter: Payer: Self-pay | Admitting: Obstetrics and Gynecology

## 2016-01-10 ENCOUNTER — Ambulatory Visit (INDEPENDENT_AMBULATORY_CARE_PROVIDER_SITE_OTHER): Payer: BLUE CROSS/BLUE SHIELD | Admitting: Obstetrics and Gynecology

## 2016-01-10 ENCOUNTER — Ambulatory Visit
Admission: RE | Admit: 2016-01-10 | Discharge: 2016-01-10 | Disposition: A | Discharge status: Home / Self Care | Payer: BLUE CROSS/BLUE SHIELD | Source: Ambulatory Visit | Attending: Obstetrics and Gynecology | Admitting: Obstetrics and Gynecology

## 2016-01-10 VITALS — BP 99/68 | HR 80 | Ht 65.0 in | Wt 136.2 lb

## 2016-01-10 DIAGNOSIS — Z23 Encounter for immunization: Secondary | ICD-10-CM | POA: Diagnosis not present

## 2016-01-10 DIAGNOSIS — N952 Postmenopausal atrophic vaginitis: Secondary | ICD-10-CM

## 2016-01-10 DIAGNOSIS — Z780 Asymptomatic menopausal state: Secondary | ICD-10-CM | POA: Diagnosis not present

## 2016-01-10 DIAGNOSIS — Z01419 Encounter for gynecological examination (general) (routine) without abnormal findings: Secondary | ICD-10-CM

## 2016-01-10 DIAGNOSIS — M8580 Other specified disorders of bone density and structure, unspecified site: Secondary | ICD-10-CM

## 2016-01-10 DIAGNOSIS — Z1231 Encounter for screening mammogram for malignant neoplasm of breast: Secondary | ICD-10-CM | POA: Insufficient documentation

## 2016-01-10 DIAGNOSIS — Z1239 Encounter for other screening for malignant neoplasm of breast: Secondary | ICD-10-CM

## 2016-01-10 DIAGNOSIS — Z1211 Encounter for screening for malignant neoplasm of colon: Secondary | ICD-10-CM | POA: Diagnosis not present

## 2016-01-10 MED ORDER — TRAZODONE HCL 150 MG PO TABS: 150.0000 mg | ORAL_TABLET | Freq: Every day | ORAL | 0 refills | Status: DC

## 2016-01-10 MED ORDER — OMEPRAZOLE 40 MG PO CPDR: 40.0000 mg | DELAYED_RELEASE_CAPSULE | Freq: Every day | ORAL | 3 refills | Status: DC

## 2016-01-10 NOTE — Patient Instructions (Addendum)
1. Pap smear is done 2. Mammogram is already completed 3. Stool guaiac cards are given 4. Screening labs may be drawn later patient's request once she checks with insurance carrier 5. Continue with healthy eating and exercise 6. Recommend calcium with vitamin D supplementation daily.  7. Return in 1 year  Influenza (Flu) Vaccine (Inactivated or Recombinant):  1. Why get vaccinated? Influenza ("flu") is a contagious disease that spreads around the [place] every year, usually between October and May. Flu is caused by influenza viruses, and is spread mainly by coughing, sneezing, and close contact. Anyone can get flu. Flu strikes suddenly and can last several days. Symptoms vary by age, but can include:  fever/chills  sore throat  muscle aches  fatigue  cough  headache  runny or stuffy nose Flu can also lead to pneumonia and blood infections, and cause diarrhea and seizures in children. If you have a medical condition, such as heart or lung disease, flu can make it worse. Flu is more dangerous for some people. Infants and young children, people 60 years of age and older, pregnant women, and people with certain health conditions or a weakened immune system are at greatest risk. Each year thousands of people in the [place] die from flu, and many more are hospitalized. Flu vaccine can:  keep you from getting flu,  make flu less severe if you do get it, and  keep you from spreading flu to your family and other people. 2. Inactivated and recombinant flu vaccines A dose of flu vaccine is recommended every flu season. Children 6 months through 518 years of age may need two doses during the same flu season. Everyone else needs only one dose each flu season. Some inactivated flu vaccines contain a very small amount of a mercury-based preservative called thimerosal. Studies have not shown thimerosal in vaccines to be harmful, but flu vaccines that do not contain thimerosal are  available. There is no live flu virus in flu shots. They cannot cause the flu. There are many flu viruses, and they are always changing. Each year a new flu vaccine is made to protect against three or four viruses that are likely to cause disease in the upcoming flu season. But even when the vaccine doesn't exactly match these viruses, it may still provide some protection. Flu vaccine cannot prevent:  flu that is caused by a virus not covered by the vaccine, or  illnesses that look like flu but are not. It takes about 2 weeks for protection to develop after vaccination, and protection lasts through the flu season. 3. Some people should not get this vaccine Tell the person who is giving you the vaccine:  If you have any severe, life-threatening allergies. If you ever had a life-threatening allergic reaction after a dose of flu vaccine, or have a severe allergy to any part of this vaccine, you may be advised not to get vaccinated. Most, but not all, types of flu vaccine contain a small amount of egg protein.  If you ever had Guillain-Barre Syndrome (also called GBS). Some people with a history of GBS should not get this vaccine. This should be discussed with your doctor.  If you are not feeling well. It is usually okay to get flu vaccine when you have a mild illness, but you might be asked to come back when you feel better. 4. Risks of a vaccine reaction With any medicine, including vaccines, there is a chance of reactions. These are usually mild and  go away on their own, but serious reactions are also possible. Most people who get a flu shot do not have any problems with it. Minor problems following a flu shot include:  soreness, redness, or swelling where the shot was given  hoarseness  sore, red or itchy eyes  cough  fever  aches  headache  itching  fatigue If these problems occur, they usually begin soon after the shot and last 1 or 2 days. More serious problems following a  flu shot can include the following:  There may be a small increased risk of Guillain-Barre Syndrome (GBS) after inactivated flu vaccine. This risk has been estimated at 1 or 2 additional cases per million people vaccinated. This is much lower than the risk of severe complications from flu, which can be prevented by flu vaccine.  Young children who get the flu shot along with pneumococcal vaccine (PCV13) and/or DTaP vaccine at the same time might be slightly more likely to have a seizure caused by fever. Ask your doctor for more information. Tell your doctor if a child who is getting flu vaccine has ever had a seizure. Problems that could happen after any injected vaccine:  People sometimes faint after a medical procedure, including vaccination. Sitting or lying down for about 15 minutes can help prevent fainting, and injuries caused by a fall. Tell your doctor if you feel dizzy, or have vision changes or ringing in the ears.  Some people get severe pain in the shoulder and have difficulty moving the arm where a shot was given. This happens very rarely.  Any medication can cause a severe allergic reaction. Such reactions from a vaccine are very rare, estimated at about 1 in a million doses, and would happen within a few minutes to a few hours after the vaccination. As with any medicine, there is a very remote chance of a vaccine causing a serious injury or death. The safety of vaccines is always being monitored. For more information, visit: http://floyd.org/ 5. What if there is a serious reaction? What should I look for?  Look for anything that concerns you, such as signs of a severe allergic reaction, very high fever, or unusual behavior. Signs of a severe allergic reaction can include hives, swelling of the face and throat, difficulty breathing, a fast heartbeat, dizziness, and weakness. These would start a few minutes to a few hours after the vaccination. What should I do?  If you  think it is a severe allergic reaction or other emergency that can't wait, call 9-1-1 and get the person to the nearest hospital. Otherwise, call your doctor.  Reactions should be reported to the Vaccine Adverse Event Reporting System (VAERS). Your doctor should file this report, or you can do it yourself through the VAERS web site at www.vaers.LAgents.no, or by calling 1-(872)103-7821. VAERS does not give medical advice. 6. The National Vaccine Injury Compensation Program The [employer] Vaccine Injury Compensation Program (VICP) is a federal program that was created to compensate people who may have been injured by certain vaccines. Persons who believe they may have been injured by a vaccine can learn about the program and about filing a claim by calling 1-708 556 4814 or visiting the VICP website at SpiritualWord.at. There is a time limit to file a claim for compensation. 7. How can I learn more?  Ask your healthcare provider. He or she can give you the vaccine package insert or suggest other sources of information.  Call your local or state health department.  Contact  the Centers for Disease Control and Prevention (CDC):  Call 541-644-0812 (1-800-CDC-INFO) or  Visit CDC's website at BiotechRoom.com.cy Vaccine Information Statement Inactivated Influenza Vaccine (11/06/2013)   This information is not intended to replace advice given to you by your health care provider. Make sure you discuss any questions you have with your health care provider.   Document Released: 01/11/2006 Document Revised: 04/09/2014 Document Reviewed: 11/09/2013 Elsevier Interactive Patient Education [employer]

## 2016-01-10 NOTE — Addendum Note (Signed)
Addended by: Marchelle FolksMILLER, Merrissa Giacobbe G on: 01/10/2016 03:52 PM   Modules accepted: Orders

## 2016-01-13 LAB — PAP IG AND HPV HIGH-RISK
HPV, high-risk: NEGATIVE
PAP SMEAR COMMENT: 0

## 2016-01-16 ENCOUNTER — Telehealth: Payer: Self-pay | Admitting: Obstetrics and Gynecology

## 2016-01-16 MED ORDER — ESTROGENS, CONJUGATED 0.625 MG/GM VA CREA: 0.2500 | TOPICAL_CREAM | VAGINAL | 12 refills | Status: DC

## 2016-01-16 NOTE — Telephone Encounter (Signed)
Pt aware med [facility]

## 2016-01-16 NOTE — Telephone Encounter (Signed)
PT CALLED AND SHE WAS HERE LAST Tuesday THE 10TH, AND SHE WAS GIVEN PRIMERIN CREAM AND IS REQUESTING A RX TO BE CALLED IN THE THE [facility] [place].

## 2016-01-30 ENCOUNTER — Telehealth: Payer: Self-pay | Admitting: Obstetrics and Gynecology

## 2016-01-30 NOTE — Telephone Encounter (Signed)
Pt is interested in a trial of HRT. Aware mad will get this message on Tuedsay 10/31.

## 2016-01-30 NOTE — Telephone Encounter (Signed)
PT CALLED AND SHE SAW DR DE AND KNOW WANTED TO TRY THE MEDICATION THAT HE RECOMMENDED FOR HOT FLASHES, TO HELP HER SLEEP AT NIGHT.

## 2016-02-02 MED ORDER — CONJ ESTROG-MEDROXYPROGEST ACE 0.625-2.5 MG PO TABS: 1.0000 | ORAL_TABLET | Freq: Every day | ORAL | 1 refills | Status: DC

## 2016-02-02 NOTE — Telephone Encounter (Signed)
Vm full- per mad ok to erx prempro 0.625/2.5. Will try to contact pt again.

## 2016-02-02 NOTE — Telephone Encounter (Signed)
Pt aware. 6 week f/u appt made for 12/14.

## 2016-03-03 LAB — FECAL OCCULT BLOOD, IMMUNOCHEMICAL: Fecal Occult Bld: NEGATIVE

## 2016-03-15 ENCOUNTER — Encounter: Payer: BLUE CROSS/BLUE SHIELD | Admitting: Obstetrics and Gynecology

## 2016-03-20 ENCOUNTER — Encounter: Payer: BLUE CROSS/BLUE SHIELD | Admitting: Obstetrics and Gynecology

## 2016-04-26 ENCOUNTER — Other Ambulatory Visit: Payer: Self-pay | Admitting: Obstetrics and Gynecology

## 2016-07-16 ENCOUNTER — Other Ambulatory Visit: Payer: Self-pay | Admitting: Internal Medicine

## 2016-07-17 NOTE — Telephone Encounter (Signed)
Not in pt's med list. Is it ok to refill?

## 2016-07-17 NOTE — Telephone Encounter (Signed)
refilled 

## 2016-07-25 ENCOUNTER — Other Ambulatory Visit: Payer: Self-pay | Admitting: Obstetrics and Gynecology

## 2016-09-05 ENCOUNTER — Telehealth: Payer: Self-pay | Admitting: Internal Medicine

## 2016-09-05 MED ORDER — PREDNISONE 10 MG PO TABS: ORAL_TABLET | ORAL | 0 refills | Status: DC

## 2016-09-05 MED ORDER — BENZONATATE 200 MG PO CAPS: 200.0000 mg | ORAL_CAPSULE | Freq: Two times a day (BID) | ORAL | 0 refills | Status: DC | PRN

## 2016-09-05 NOTE — Telephone Encounter (Signed)
Please advise 

## 2016-09-05 NOTE — Telephone Encounter (Signed)
Prednisone taper and tessalon perles sent to [facility]

## 2016-09-05 NOTE — Telephone Encounter (Signed)
She has had a dry cough since last Thursday. She has tried nyquill and Delsyne cough syrup with no improvement. She does has not had or have fever or body aches. She states that it hurts when she coughs. She is allergic to codeine. Can a cough syrup be sent to [facility] in [place]? Pt cb [phone]

## 2016-09-05 NOTE — Telephone Encounter (Signed)
Patient notified

## 2016-10-23 ENCOUNTER — Other Ambulatory Visit: Payer: Self-pay | Admitting: Obstetrics and Gynecology

## 2016-10-23 MED ORDER — TRAZODONE HCL 150 MG PO TABS: 150.0000 mg | ORAL_TABLET | Freq: Every day | ORAL | 0 refills | Status: DC

## 2016-11-27 ENCOUNTER — Other Ambulatory Visit: Payer: Self-pay | Admitting: Obstetrics and Gynecology

## 2017-01-15 ENCOUNTER — Encounter: Payer: BLUE CROSS/BLUE SHIELD | Admitting: Obstetrics and Gynecology

## 2017-01-18 ENCOUNTER — Other Ambulatory Visit: Payer: Self-pay | Admitting: Internal Medicine

## 2017-01-18 DIAGNOSIS — Z1231 Encounter for screening mammogram for malignant neoplasm of breast: Secondary | ICD-10-CM

## 2017-01-21 ENCOUNTER — Ambulatory Visit (INDEPENDENT_AMBULATORY_CARE_PROVIDER_SITE_OTHER): Payer: BLUE CROSS/BLUE SHIELD

## 2017-01-21 DIAGNOSIS — Z23 Encounter for immunization: Secondary | ICD-10-CM

## 2017-01-31 ENCOUNTER — Other Ambulatory Visit: Payer: Self-pay | Admitting: Pulmonary Disease

## 2017-01-31 ENCOUNTER — Other Ambulatory Visit: Payer: Self-pay | Admitting: Obstetrics and Gynecology

## 2017-02-04 NOTE — Progress Notes (Signed)
ANNUAL PREVENTATIVE CARE GYN  ENCOUNTER NOTE  Subjective:       LEEANNE Barrett is a 61 y.o. [id number] female here for a routine annual gynecologic exam.  Current complaints: 1.  None  Patient is currently taking Prempro 2.5 mg daily.  Not experiencing any vasomotor symptoms.  She also is using Premarin cream intravaginally once a week.  She is rarely having sexual intercourse and is not symptomatic. Patient is on methotrexate now for eczema and symptoms appear to be under control.  She is taking extra folic acid as a supplement.  Bowel and bladder function are normal.  Mammogram has already been completed with results pending.   Gynecologic History No LMP recorded. Patient is postmenopausal. Contraception: post menopausal status Last Pap: 01/2016 neg/neg . Results were: normal Last mammogram:  01/10/2016- [facility]. Results were:  Menarche age 61. Menopause age 47. No history of HRT use. No history of vasomotor symptoms. No history of abnormal Pap smears; last Pap 2014-normal. No history of abnormal mammograms. Recent diagnosis of osteopenia; patient declines bisphosphonates and HRT.  She desires.  Calcium with vitamin D and exercise management.    Obstetric History OB History  Gravida Para Term Preterm AB Living  3 3 3     3   SAB TAB Ectopic Multiple Live Births          3    # Outcome Date GA Lbr Len/2nd Weight Sex Delivery Anes PTL Lv  3 Term 1990   6 lb 16 oz (3.175 kg) M VBAC   LIV  2 Term 1977   7 lb 1.6 oz (3.221 kg) F VBAC   LIV  1 Term 1975   6 lb 16 oz (3.175 kg) F VBAC   LIV      Past Medical History:  Diagnosis Date  . Acid reflux   . Allergy   . Chicken pox   . Eczema   . Osteopenia     Past Surgical History:  Procedure Laterality Date  . INNER EAR SURGERY      Current Outpatient Medications on File Prior to Visit  Medication Sig Dispense Refill  . benzonatate (TESSALON) 200 MG capsule Take 1 capsule (200 mg total) by mouth 2 (two) times daily as  needed for cough. 20 capsule 0  . BREO ELLIPTA 100-25 MCG/INH AEPB USE 1 INHALATION DAILY (MUST BE SEEN BEFORE ANY FURTHER REFILLS) 10800 each 0  . Coal Tar Extract 2.5 % EMUL Use nightly to siak heel for 15 minutes 221 mL 3  . conjugated estrogens (PREMARIN) vaginal cream Place 0.25 Applicatorfuls vaginally 2 (two) times a week. 42.5 g 12  . Dermatological Products, Misc. (HPR PLUS) CREA     . estrogen, conjugated,-medroxyprogesterone (PREMPRO) 0.625-2.5 MG tablet Take 1 tablet by mouth daily. 90 tablet 1  . fluticasone (FLONASE) 50 MCG/ACT nasal spray USE 2 SPRAYS IN EACH NOSTRIL DAILY 16 g 3  . HALOG 0.1 % CREA     . loteprednol (LOTEMAX) 0.5 % ophthalmic suspension Place 1 drop into both eyes 2 (two) times daily.    Marland Kitchen omeprazole (PRILOSEC) 40 MG capsule Take 1 capsule (40 mg total) by mouth daily. 90 capsule 3  . predniSONE (DELTASONE) 10 MG tablet 6 tablets on Day 1 , then reduce by 1 tablet daily until gone 21 tablet 0  . traZODone (DESYREL) 150 MG tablet TAKE 1 TABLET AT BEDTIME 60 tablet 0   No current facility-administered medications on file prior to visit.  Allergies  Allergen Reactions  . Codeine Nausea And Vomiting    Social History   Socioeconomic History  . Marital status: Married    Spouse name: Not on file  . Number of children: Not on file  . Years of education: Not on file  . Highest education level: Not on file  Social Needs  . Financial resource strain: Not on file  . Food insecurity - worry: Not on file  . Food insecurity - inability: Not on file  . Transportation needs - medical: Not on file  . Transportation needs - non-medical: Not on file  Occupational History  . Not on file  Tobacco Use  . Smoking status: Former Smoker    Packs/day: 0.50    Types: Cigarettes    Last attempt to quit: 07/05/2006    Years since quitting: 10.5  . Smokeless tobacco: Never Used  Substance and Sexual Activity  . Alcohol use: Yes    Alcohol/week: 0.0 oz    Comment:  occas  . Drug use: No  . Sexual activity: Yes    Birth control/protection: Post-menopausal  Other Topics Concern  . Not on file  Social History Narrative  . Not on file    Family History  Problem Relation Age of Onset  . Heart disease Mother   . Ovarian cancer Maternal Aunt   . Diabetes Daughter   . Breast cancer Neg Hx   . Colon cancer Neg Hx     The following portions of the patient's history were reviewed and updated as appropriate: allergies, current medications, past family history, past medical history, past social history, past surgical history and problem list.  Review of Systems Review of Systems  Constitutional: Negative.   HENT: Negative.   Eyes: Negative.   Cardiovascular: Negative.   Gastrointestinal: Negative.   Genitourinary: Negative.   Musculoskeletal: Negative.   Skin: Negative.        Eczema is under control  Neurological: Negative.   Endo/Heme/Allergies: Negative.   Psychiatric/Behavioral: Negative.      Objective:   BP 102/67   Pulse 78   Ht 5\' 5"  (1.651 m)   Wt 134 lb 12.8 oz (61.1 kg)   BMI 22.43 kg/m  CONSTITUTIONAL: Well-developed, well-nourished female in no acute distress.  PSYCHIATRIC: Normal mood and affect. Normal behavior. Normal judgment and thought content. NEUROLGIC: Alert and oriented to person, place, and time. Normal muscle tone coordination. No cranial nerve deficit noted. HENT:  Normocephalic, atraumatic, External right and left ear normal. Oropharynx is clear and moist EYES: Conjunctivae and EOM are normal.  No scleral icterus.  NECK: Normal range of motion, supple, no masses.  Normal thyroid.  SKIN: Skin is warm and dry. No rash noted. Not diaphoretic. No erythema. No pallor. CARDIOVASCULAR: Normal heart rate noted, regular rhythm, no murmur. RESPIRATORY: Clear to auscultation bilaterally. Effort and breath sounds normal, no problems with respiration noted. BREASTS: Symmetric in size. No masses, skin changes, nipple  drainage, or lymphadenopathy. ABDOMEN: Soft, normal bowel sounds, no distention noted.  No tenderness, rebound or guarding.  BLADDER: Normal PELVIC:  External Genitalia: Atrophic changes  BUS: Mild atrophic changes  Vagina: Mild vaginal atrophy  Cervix: Normal; no lesions  Uterus: Normal; small midplane mobile and nontender  Adnexa: Normal; nonpalpable and nontender  RV: Thin rectal sphincter, External Exam NormaI, No Rectal Masses and Normal Sphincter tone  MUSCULOSKELETAL: Normal range of motion. No tenderness.  No cyanosis, clubbing, or edema.  2+ distal pulses. LYMPHATIC: No Axillary, Supraclavicular, or Inguinal  Adenopathy.    Assessment:   Annual gynecologic examination 61 y.o. Contraception: post menopausal status Normal BMI Problem List Items Addressed This Visit    Menopause   Osteopenia   Vaginal atrophy    Other Visit Diagnoses    Well woman exam with routine gynecological exam    -  Primary   Screening for colon cancer       Screening for breast cancer        Well-tolerated HRT; patient wishes to continue  Plan:  Pap: Due 2020 Mammogram:scheduled for 02/06/2017 Stool Guaiac Testing:  Ordered Labs:declines Routine preventative health maintenance measures emphasized: Exercise/Diet/Weight control, Tobacco Warnings and Alcohol/Substance use risks Premarin cream intravaginal twice a week Return to Clinic - 1 Year   [employer], CMA  Herold Harms, MD  Note: This dictation was prepared with Dragon dictation along with smaller phrase technology. Any transcriptional errors that result from this process are unintentional.

## 2017-02-06 ENCOUNTER — Encounter: Payer: Self-pay | Admitting: Obstetrics and Gynecology

## 2017-02-06 ENCOUNTER — Ambulatory Visit
Admission: RE | Admit: 2017-02-06 | Discharge: 2017-02-06 | Disposition: A | Discharge status: Home / Self Care | Payer: BLUE CROSS/BLUE SHIELD | Source: Ambulatory Visit | Attending: Internal Medicine | Admitting: Internal Medicine

## 2017-02-06 ENCOUNTER — Ambulatory Visit (INDEPENDENT_AMBULATORY_CARE_PROVIDER_SITE_OTHER): Payer: BLUE CROSS/BLUE SHIELD | Admitting: Obstetrics and Gynecology

## 2017-02-06 VITALS — BP 102/67 | HR 78 | Ht 65.0 in | Wt 134.8 lb

## 2017-02-06 DIAGNOSIS — M8580 Other specified disorders of bone density and structure, unspecified site: Secondary | ICD-10-CM

## 2017-02-06 DIAGNOSIS — Z01419 Encounter for gynecological examination (general) (routine) without abnormal findings: Secondary | ICD-10-CM | POA: Diagnosis not present

## 2017-02-06 DIAGNOSIS — N952 Postmenopausal atrophic vaginitis: Secondary | ICD-10-CM

## 2017-02-06 DIAGNOSIS — Z780 Asymptomatic menopausal state: Secondary | ICD-10-CM | POA: Diagnosis not present

## 2017-02-06 DIAGNOSIS — Z1211 Encounter for screening for malignant neoplasm of colon: Secondary | ICD-10-CM

## 2017-02-06 DIAGNOSIS — Z1239 Encounter for other screening for malignant neoplasm of breast: Secondary | ICD-10-CM

## 2017-02-06 DIAGNOSIS — Z1231 Encounter for screening mammogram for malignant neoplasm of breast: Secondary | ICD-10-CM

## 2017-02-06 NOTE — Patient Instructions (Signed)
1.  No Pap smear done.  Next Pap is due in 2020 2.  Mammogram is ordered 3.  Stool guaiac cards are given for colon cancer screening 4.  Continue with healthy eating and exercise. 5.  Continue with calcium and vitamin D supplementation 6.  Continue with Prempro 0.625/2.5 mg daily 7.  Continue with Premarin cream intravaginal once or twice a week 8.  Return in 1 year for annual exam   Health Maintenance for Postmenopausal Women Menopause is a normal process in which your reproductive ability comes to an end. This process happens gradually over a span of months to years, usually between the ages of 39 and 47. Menopause is complete when you have missed 12 consecutive menstrual periods. It is important to talk with your health care provider about some of the most common conditions that affect postmenopausal women, such as heart disease, cancer, and bone loss (osteoporosis). Adopting a healthy lifestyle and getting preventive care can help to promote your health and wellness. Those actions can also lower your chances of developing some of these common conditions. What should I know about menopause? During menopause, you may experience a number of symptoms, such as:  Moderate-to-severe hot flashes.  Night sweats.  Decrease in sex drive.  Mood swings.  Headaches.  Tiredness.  Irritability.  Memory problems.  Insomnia.  Choosing to treat or not to treat menopausal changes is an individual decision that you make with your health care provider. What should I know about hormone replacement therapy and supplements? Hormone therapy products are effective for treating symptoms that are associated with menopause, such as hot flashes and night sweats. Hormone replacement carries certain risks, especially as you become older. If you are thinking about using estrogen or estrogen with progestin treatments, discuss the benefits and risks with your health care provider. What should I know about heart  disease and stroke? Heart disease, heart attack, and stroke become more likely as you age. This may be due, in part, to the hormonal changes that your body experiences during menopause. These can affect how your body processes dietary fats, triglycerides, and cholesterol. Heart attack and stroke are both medical emergencies. There are many things that you can do to help prevent heart disease and stroke:  Have your blood pressure checked at least every 1-2 years. High blood pressure causes heart disease and increases the risk of stroke.  If you are 51-23 years old, ask your health care provider if you should take aspirin to prevent a heart attack or a stroke.  Do not use any tobacco products, including cigarettes, chewing tobacco, or electronic cigarettes. If you need help quitting, ask your health care provider.  It is important to eat a healthy diet and maintain a healthy weight. ? Be sure to include plenty of vegetables, fruits, low-fat dairy products, and lean protein. ? Avoid eating foods that are high in solid fats, added sugars, or salt (sodium).  Get regular exercise. This is one of the most important things that you can do for your health. ? Try to exercise for at least 150 minutes each week. The type of exercise that you do should increase your heart rate and make you sweat. This is known as moderate-intensity exercise. ? Try to do strengthening exercises at least twice each week. Do these in addition to the moderate-intensity exercise.  Know your numbers.Ask your health care provider to check your cholesterol and your blood glucose. Continue to have your blood tested as directed by your health  care provider.  What should I know about cancer screening? There are several types of cancer. Take the following steps to reduce your risk and to catch any cancer development as early as possible. Breast Cancer  Practice breast self-awareness. ? This means understanding how your breasts  normally appear and feel. ? It also means doing regular breast self-exams. Let your health care provider know about any changes, no matter how small.  If you are 53 or older, have a clinician do a breast exam (clinical breast exam or CBE) every year. Depending on your age, family history, and medical history, it may be recommended that you also have a yearly breast X-ray (mammogram).  If you have a family history of breast cancer, talk with your health care provider about genetic screening.  If you are at high risk for breast cancer, talk with your health care provider about having an MRI and a mammogram every year.  Breast cancer (BRCA) gene test is recommended for women who have family members with BRCA-related cancers. Results of the assessment will determine the need for genetic counseling and BRCA1 and for BRCA2 testing. BRCA-related cancers include these types: ? Breast. This occurs in males or females. ? Ovarian. ? Tubal. This may also be called fallopian tube cancer. ? Cancer of the abdominal or pelvic lining (peritoneal cancer). ? Prostate. ? Pancreatic.  Cervical, Uterine, and Ovarian Cancer Your health care provider may recommend that you be screened regularly for cancer of the pelvic organs. These include your ovaries, uterus, and vagina. This screening involves a pelvic exam, which includes checking for microscopic changes to the surface of your cervix (Pap test).  For women ages 21-65, health care providers may recommend a pelvic exam and a Pap test every three years. For women ages 22-65, they may recommend the Pap test and pelvic exam, combined with testing for human papilloma virus (HPV), every five years. Some types of HPV increase your risk of cervical cancer. Testing for HPV may also be done on women of any age who have unclear Pap test results.  Other health care providers may not recommend any screening for nonpregnant women who are considered low risk for pelvic cancer  and have no symptoms. Ask your health care provider if a screening pelvic exam is right for you.  If you have had past treatment for cervical cancer or a condition that could lead to cancer, you need Pap tests and screening for cancer for at least 20 years after your treatment. If Pap tests have been discontinued for you, your risk factors (such as having a new sexual partner) need to be reassessed to determine if you should start having screenings again. Some women have medical problems that increase the chance of getting cervical cancer. In these cases, your health care provider may recommend that you have screening and Pap tests more often.  If you have a family history of uterine cancer or ovarian cancer, talk with your health care provider about genetic screening.  If you have vaginal bleeding after reaching menopause, tell your health care provider.  There are currently no reliable tests available to screen for ovarian cancer.  Lung Cancer Lung cancer screening is recommended for adults 40-64 years old who are at high risk for lung cancer because of a history of smoking. A yearly low-dose CT scan of the lungs is recommended if you:  Currently smoke.  Have a history of at least 30 pack-years of smoking and you currently smoke or have quit  within the past 15 years. A pack-year is smoking an average of one pack of cigarettes per day for one year.  Yearly screening should:  Continue until it has been 15 years since you quit.  Stop if you develop a health problem that would prevent you from having lung cancer treatment.  Colorectal Cancer  This type of cancer can be detected and can often be prevented.  Routine colorectal cancer screening usually begins at age 48 and continues through age 73.  If you have risk factors for colon cancer, your health care provider may recommend that you be screened at an earlier age.  If you have a family history of colorectal cancer, talk with your  health care provider about genetic screening.  Your health care provider may also recommend using home test kits to check for hidden blood in your stool.  A small camera at the end of a tube can be used to examine your colon directly (sigmoidoscopy or colonoscopy). This is done to check for the earliest forms of colorectal cancer.  Direct examination of the colon should be repeated every 5-10 years until age 50. However, if early forms of precancerous polyps or small growths are found or if you have a family history or genetic risk for colorectal cancer, you may need to be screened more often.  Skin Cancer  Check your skin from head to toe regularly.  Monitor any moles. Be sure to tell your health care provider: ? About any new moles or changes in moles, especially if there is a change in a mole's shape or color. ? If you have a mole that is larger than the size of a pencil eraser.  If any of your family members has a history of skin cancer, especially at a young age, talk with your health care provider about genetic screening.  Always use sunscreen. Apply sunscreen liberally and repeatedly throughout the day.  Whenever you are outside, protect yourself by wearing long sleeves, pants, a wide-brimmed hat, and sunglasses.  What should I know about osteoporosis? Osteoporosis is a condition in which bone destruction happens more quickly than new bone creation. After menopause, you may be at an increased risk for osteoporosis. To help prevent osteoporosis or the bone fractures that can happen because of osteoporosis, the following is recommended:  If you are 39-82 years old, get at least 1,000 mg of calcium and at least 600 mg of vitamin D per day.  If you are older than age 34 but younger than age 43, get at least 1,200 mg of calcium and at least 600 mg of vitamin D per day.  If you are older than age 45, get at least 1,200 mg of calcium and at least 800 mg of vitamin D per day.  Smoking  and excessive alcohol intake increase the risk of osteoporosis. Eat foods that are rich in calcium and vitamin D, and do weight-bearing exercises several times each week as directed by your health care provider. What should I know about how menopause affects my mental health? Depression may occur at any age, but it is more common as you become older. Common symptoms of depression include:  Low or sad mood.  Changes in sleep patterns.  Changes in appetite or eating patterns.  Feeling an overall lack of motivation or enjoyment of activities that you previously enjoyed.  Frequent crying spells.  Talk with your health care provider if you think that you are experiencing depression. What should I know about immunizations?  It is important that you get and maintain your immunizations. These include:  Tetanus, diphtheria, and pertussis (Tdap) booster vaccine.  Influenza every year before the flu season begins.  Pneumonia vaccine.  Shingles vaccine.  Your health care provider may also recommend other immunizations. This information is not intended to replace advice given to you by your health care provider. Make sure you discuss any questions you have with your health care provider. Document Released: 05/11/2005 Document Revised: 10/07/2015 Document Reviewed: 12/21/2014 Elsevier Interactive Patient Education  2018 [employer].

## 2017-02-10 DIAGNOSIS — Z1211 Encounter for screening for malignant neoplasm of colon: Secondary | ICD-10-CM | POA: Diagnosis not present

## 2017-02-11 ENCOUNTER — Other Ambulatory Visit: Payer: Self-pay | Admitting: Obstetrics and Gynecology

## 2017-02-19 LAB — FECAL OCCULT BLOOD, IMMUNOCHEMICAL: Fecal Occult Bld: NEGATIVE

## 2017-02-26 ENCOUNTER — Telehealth: Payer: Self-pay | Admitting: Obstetrics and Gynecology

## 2017-02-26 NOTE — Telephone Encounter (Signed)
The patient called and stated that she would like a all back from [facility] to to get one of her prescriptions refilled, I believe she said  conjugated estrogens (PREMARIN) vaginal cream on the vm. The patient also stated that her preferred pharmacy is [facility] in [place]. No other information was disclosed. Please advise.

## 2017-02-27 MED ORDER — CONJ ESTROG-MEDROXYPROGEST ACE 0.625-2.5 MG PO TABS
1.0000 | ORAL_TABLET | Freq: Every day | ORAL | 0 refills | Status: DC
Start: 2017-02-27 — End: 2017-02-27

## 2017-02-27 MED ORDER — CONJ ESTROG-MEDROXYPROGEST ACE 0.625-2.5 MG PO TABS: 1.0000 | ORAL_TABLET | Freq: Every day | ORAL | 3 refills | Status: DC

## 2017-02-27 NOTE — Telephone Encounter (Signed)
Pt needs refill of prempro 2.5. 30 d supply to [facility] in [place] and 90 day to [facility].

## 2017-04-01 ENCOUNTER — Other Ambulatory Visit: Payer: Self-pay | Admitting: Obstetrics and Gynecology

## 2017-04-23 DIAGNOSIS — R21 Rash and other nonspecific skin eruption: Secondary | ICD-10-CM | POA: Diagnosis not present

## 2017-04-23 DIAGNOSIS — Z79899 Other long term (current) drug therapy: Secondary | ICD-10-CM | POA: Diagnosis not present

## 2017-04-29 DIAGNOSIS — Z79899 Other long term (current) drug therapy: Secondary | ICD-10-CM | POA: Diagnosis not present

## 2017-06-06 ENCOUNTER — Telehealth: Payer: Self-pay | Admitting: *Deleted

## 2017-06-06 ENCOUNTER — Ambulatory Visit: Payer: Self-pay | Admitting: *Deleted

## 2017-06-06 ENCOUNTER — Ambulatory Visit: Payer: BLUE CROSS/BLUE SHIELD | Admitting: Family Medicine

## 2017-06-06 ENCOUNTER — Other Ambulatory Visit: Payer: Self-pay | Admitting: Internal Medicine

## 2017-06-06 NOTE — Telephone Encounter (Signed)
Called pt regarding medication request; nurse triage initiated; pt states that her cough statrted on 06/02/17 and has been worsening daily; she also reports itchy eyes with discharge, and feels tired; recommendations made per protocol to include seeing a physician within 3 days; pt offered and accepted appointment today with Delbert HarnessJulie Kordsmeier at 1445; pt verbalizes understanding.  Reason for Disposition . [1] Patient also has allergy symptoms (e.g., itchy eyes, clear nasal discharge, postnasal drip) AND [2] they are acting up  Answer Assessment - Initial Assessment Questions 1. ONSET: "When did the cough begin?"      Sunday 06/02/17 2. SEVERITY: "How bad is the cough today?"      Dry cough all morning 3. RESPIRATORY DISTRESS: "Describe your breathing."      ok 4. FEVER: "Do you have a fever?" If so, ask: "What is your temperature, how was it measured, and when did it start?"     No 5. HEMOPTYSIS: "Are you coughing up any blood?" If so ask: "How much?" (flecks, streaks, tablespoons, etc.)     no 6. TREATMENT: "What have you done so far to treat the cough?" (e.g., meds, fluids, humidifier)     12  hr mucinex cold, cough, sinus; has used breo inhaler 7. CARDIAC HISTORY: "Do you have any history of heart disease?" (e.g., heart attack, congestive heart failure)     no 8. LUNG HISTORY: "Do you have any history of lung disease?"  (e.g., pulmonary embolus, asthma, emphysema)     asthma 9. PE RISK FACTORS: "Do you have a history of blood clots?" (or: recent major surgery, recent prolonged travel, bedridden )     no 10. OTHER SYMPTOMS: "Do you have any other symptoms? (e.g., runny nose, wheezing, chest pain)       Itchy eyes with drainage; tired 11. PREGNANCY: "Is there any chance you are pregnant?" "When was your last menstrual period?"       no 12. TRAVEL: "Have you traveled out of the country in the last month?" (e.g., travel history, exposures)       no  Protocols used: COUGH - ACUTE  NON-PRODUCTIVE-A-AH

## 2017-06-06 NOTE — Telephone Encounter (Signed)
Copied from CRM [phone]. Topic: Quick Communication - Appointment Cancellation >> Jun 06, 2017 12:50 PM [name], Brittany L wrote: Patient called to cancel appointment scheduled for today Patient has not rescheduled their appointment.    Route to department's PEC pool.

## 2017-06-07 NOTE — Telephone Encounter (Signed)
Refilled: 07/17/2016 Last OV: 12/12/2015 Next OV: not scheduled

## 2017-06-07 NOTE — Telephone Encounter (Signed)
She will be billed the $50 charge for cancelling same day

## 2017-06-07 NOTE — Telephone Encounter (Signed)
Will you please charge this pt for cancelling the same day per Dr. Darrick Huntsman.

## 2017-06-07 NOTE — Telephone Encounter (Signed)
fyi

## 2017-06-29 ENCOUNTER — Other Ambulatory Visit: Payer: Self-pay | Admitting: Obstetrics and Gynecology

## 2017-08-02 IMAGING — CR DG CHEST 2V
1 series · 2 of 2 positions shown · non-contrast
Comparison: None in PACs

CLINICAL DATA: URI symptoms; possible chronic bronchitis, known
history of asthma and previous tobacco use; abnormal chest exam.

EXAM:
CHEST  2 VIEW

[Series 1: dg chest 2 view · 0.14mm/px · 2 of 2 slices shown]
[im 1/2]
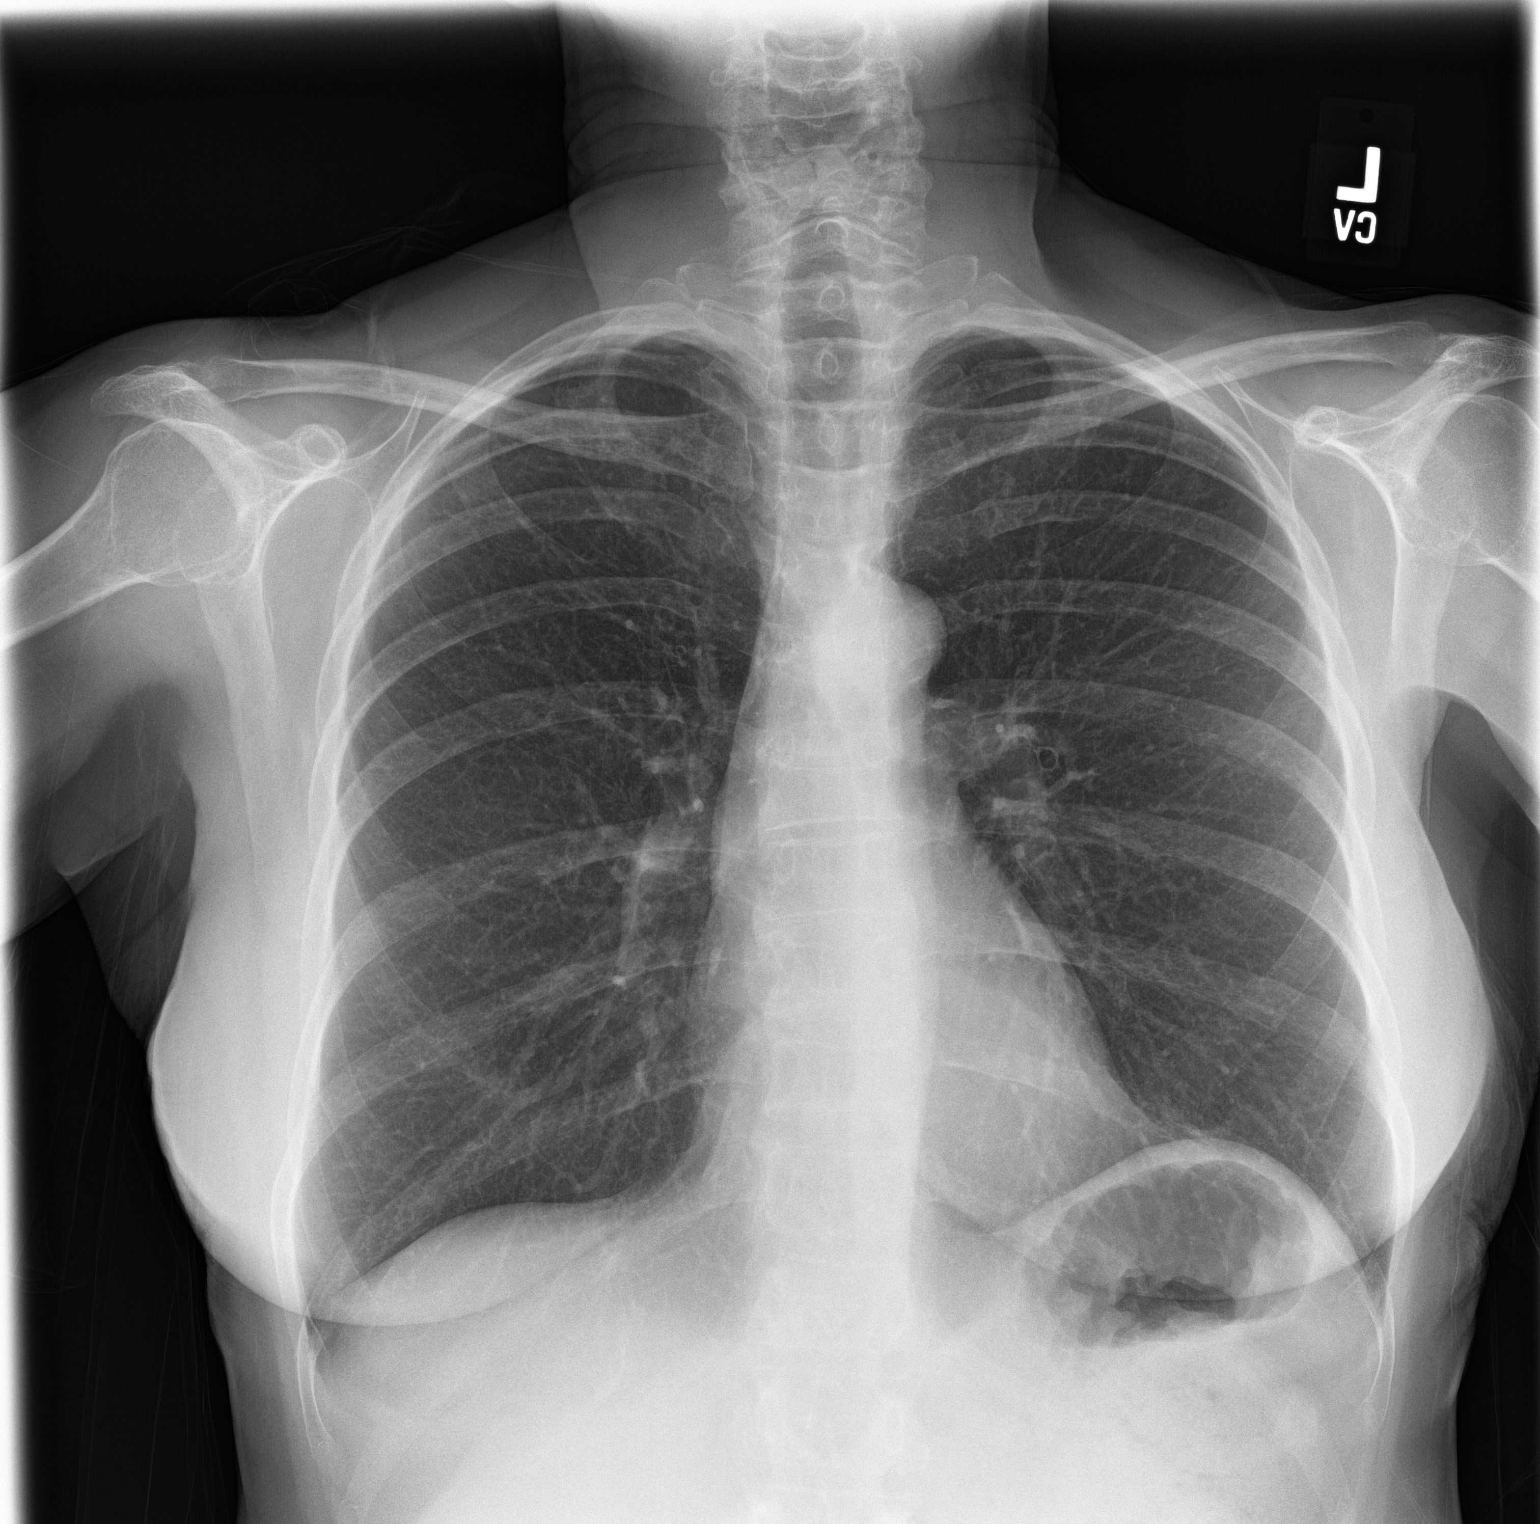
[im 2/2]
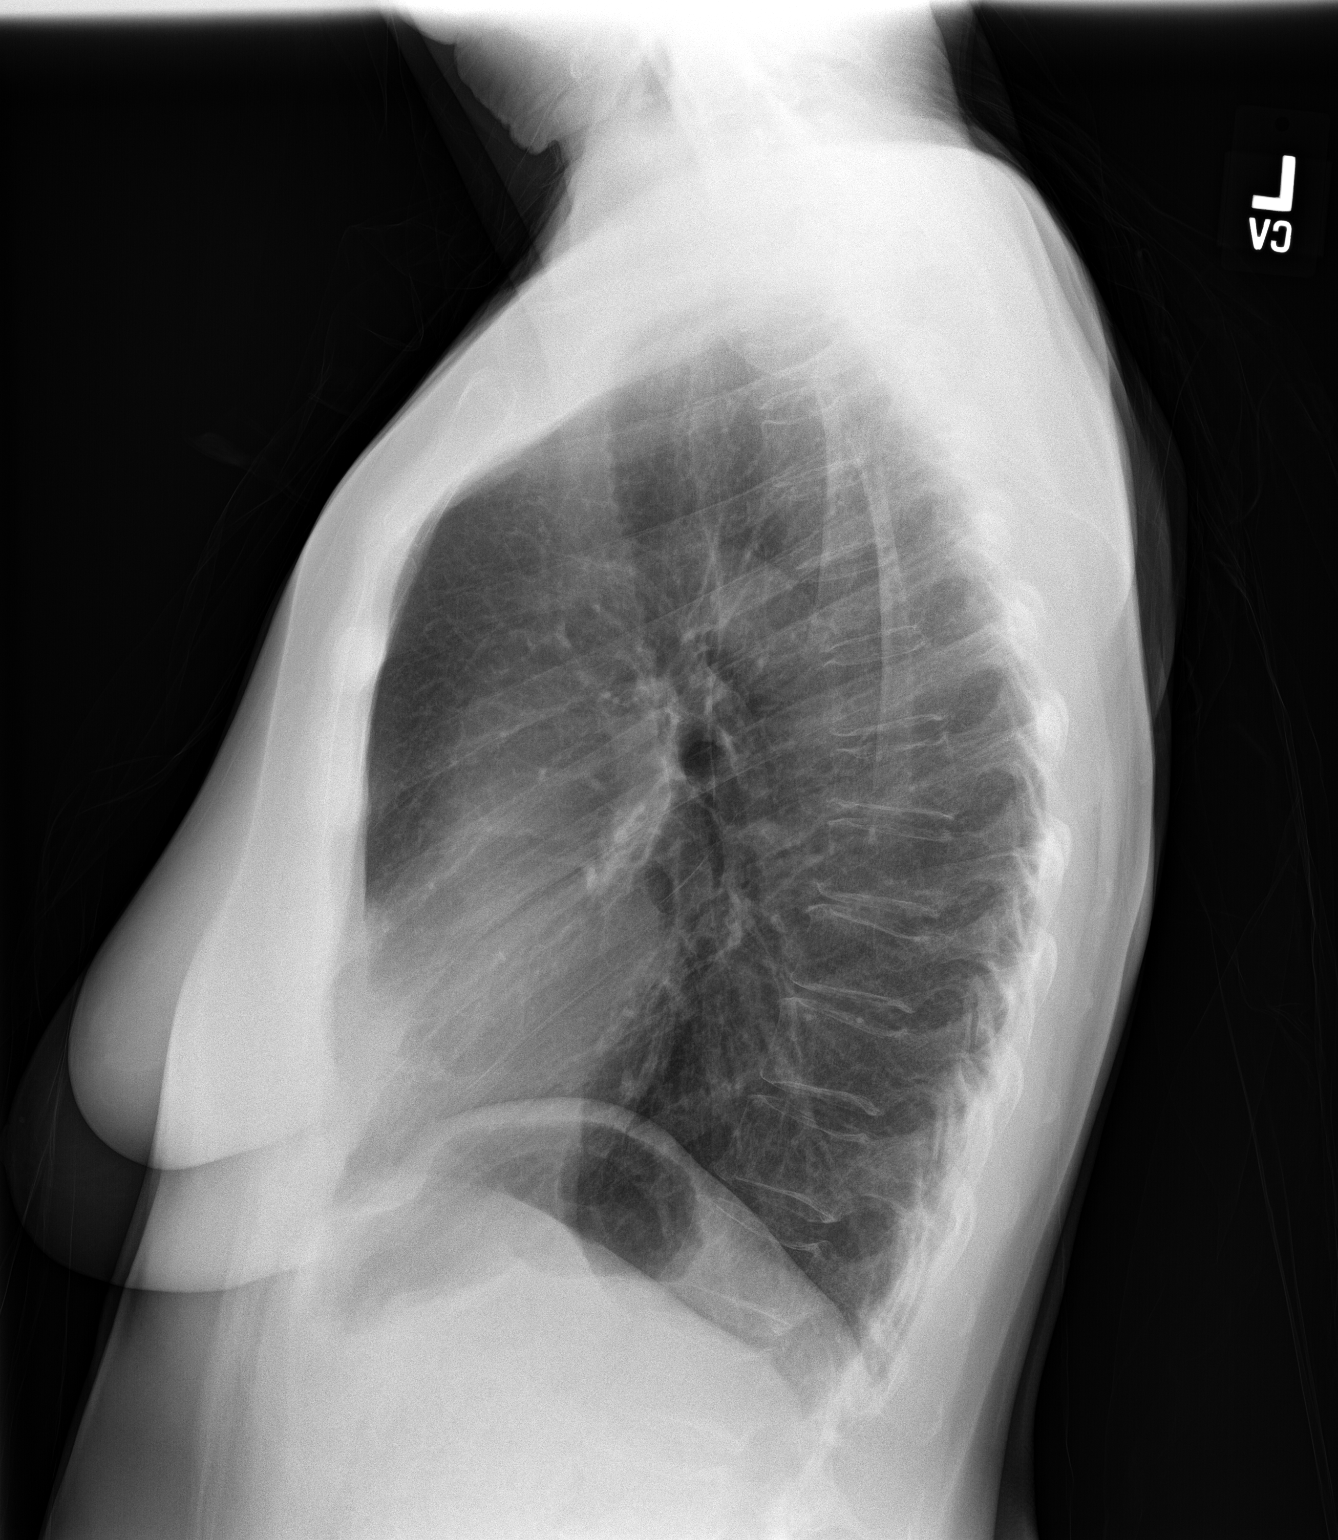

[2 of 2 positions shown; findings below may reference images not displayed]

FINDINGS: The lungs are well-expanded. There is no hemidiaphragm flattening.
The heart and pulmonary vascularity are normal. The mediastinum is
normal in width. There is no pleural effusion. There is mild
anterior wedging of a mid thoracic vertebral body.
IMPRESSION: Mild hyperinflation is consistent with acute and/or chronic
bronchitis-asthma. There is no alveolar pneumonia nor CHF.

## 2017-08-08 ENCOUNTER — Other Ambulatory Visit: Payer: Self-pay | Admitting: Obstetrics and Gynecology

## 2017-09-16 ENCOUNTER — Other Ambulatory Visit: Payer: Self-pay | Admitting: Internal Medicine

## 2017-09-17 NOTE — Telephone Encounter (Signed)
Refilled: 06/07/2017 Last OV: 12/12/2015 Next OV: not scheduled

## 2017-10-24 ENCOUNTER — Other Ambulatory Visit: Payer: Self-pay | Admitting: Obstetrics and Gynecology

## 2017-11-11 DIAGNOSIS — Z79899 Other long term (current) drug therapy: Secondary | ICD-10-CM | POA: Diagnosis not present

## 2017-11-11 DIAGNOSIS — L309 Dermatitis, unspecified: Secondary | ICD-10-CM | POA: Diagnosis not present

## 2017-11-11 DIAGNOSIS — L209 Atopic dermatitis, unspecified: Secondary | ICD-10-CM | POA: Diagnosis not present

## 2017-12-09 ENCOUNTER — Other Ambulatory Visit: Payer: Self-pay | Admitting: Obstetrics and Gynecology

## 2017-12-17 DIAGNOSIS — Z79899 Other long term (current) drug therapy: Secondary | ICD-10-CM | POA: Diagnosis not present

## 2018-01-15 ENCOUNTER — Ambulatory Visit: Payer: BLUE CROSS/BLUE SHIELD

## 2018-01-16 ENCOUNTER — Ambulatory Visit (INDEPENDENT_AMBULATORY_CARE_PROVIDER_SITE_OTHER): Payer: BLUE CROSS/BLUE SHIELD | Admitting: *Deleted

## 2018-01-16 DIAGNOSIS — Z23 Encounter for immunization: Secondary | ICD-10-CM | POA: Diagnosis not present

## 2018-01-24 ENCOUNTER — Ambulatory Visit: Payer: BLUE CROSS/BLUE SHIELD

## 2018-01-29 ENCOUNTER — Other Ambulatory Visit: Payer: Self-pay | Admitting: Obstetrics and Gynecology

## 2018-06-10 ENCOUNTER — Encounter: Payer: Self-pay | Admitting: Family Medicine

## 2018-06-10 ENCOUNTER — Ambulatory Visit: Payer: BLUE CROSS/BLUE SHIELD | Admitting: Family Medicine

## 2018-06-10 VITALS — BP 100/62 | Temp 98.9°F | Ht 65.0 in | Wt 133.0 lb

## 2018-06-10 DIAGNOSIS — R6889 Other general symptoms and signs: Secondary | ICD-10-CM | POA: Insufficient documentation

## 2018-06-10 DIAGNOSIS — G8929 Other chronic pain: Secondary | ICD-10-CM | POA: Diagnosis not present

## 2018-06-10 DIAGNOSIS — R109 Unspecified abdominal pain: Secondary | ICD-10-CM

## 2018-06-10 LAB — POC INFLUENZA A&B (BINAX/QUICKVUE)
INFLUENZA A, POC: POSITIVE — AB
Influenza B, POC: NEGATIVE

## 2018-06-10 MED ORDER — OSELTAMIVIR PHOSPHATE 75 MG PO CAPS: 75.0000 mg | ORAL_CAPSULE | Freq: Two times a day (BID) | ORAL | 0 refills | Status: DC

## 2018-06-10 NOTE — Patient Instructions (Signed)
Nice to see you.   Your flu test was positive.  Please try to stay out of work for the next 1 to 2 days. We will start you on Tamiflu. If you develop shortness of breath, cough productive of blood, fevers, worsening abdominal discomfort, blood in your stool, or any new or changing symptoms please seek medical attention.  If you are not improving over the next several days please let us know.

## 2018-06-10 NOTE — Progress Notes (Signed)
Marikay Alar, MD Phone: [phone]  Crystal Barrett is a 63 y.o. female who presents today for same day visit.   CC: cough, chronic abdominal pain  Patient notes symptoms started suddenly on Saturday.  She had felt well in the morning and then came home and had to go to bed.  She has had some nonproductive cough with feeling feverish the first couple of days.  Felt hot and cold.  She notes her chest is sore from coughing though no chest pain.  No shortness of breath.  Mild sinus congestion.  No ear pain.  She notes body aches.  She did have diarrhea for the first couple of days of the illness with some lower abdominal discomfort though the diarrhea has improved.  She reports a history of chronic abdominal pain that occurs most days.  She has a history of chronic intermittent diarrhea.  She reports evaluation with GI previously though it has been a number of years.  She would like re-evaluation to see if they can determine a cause.  No vomiting.  No nausea.  No blood in her stool.  No dysuria.  No vaginal discharge.  She does have sick contacts at work who have been treated for influenza.  She does have a history of asthma.  Social History   Tobacco Use  Smoking Status Former Smoker  . Packs/day: 0.50  . Types: Cigarettes  . Last attempt to quit: 07/05/2006  . Years since quitting: 11.9  Smokeless Tobacco Never Used     ROS see history of present illness  Objective  Physical Exam Vitals:   06/10/18 1320  BP: 100/62  Temp: 98.9 F (37.2 C)    BP Readings from Last 3 Encounters:  06/10/18 100/62  02/06/17 102/67  01/10/16 99/68   Wt Readings from Last 3 Encounters:  06/10/18 133 lb (60.3 kg)  02/06/17 134 lb 12.8 oz (61.1 kg)  01/10/16 136 lb 3.2 oz (61.8 kg)    Physical Exam Constitutional:      General: She is not in acute distress.    Appearance: She is not diaphoretic.  HENT:     Head: Normocephalic and atraumatic.  Cardiovascular:     Rate and Rhythm:  Normal rate and regular rhythm.     Heart sounds: Normal heart sounds.  Pulmonary:     Effort: Pulmonary effort is normal.     Breath sounds: Normal breath sounds.  Abdominal:     General: Bowel sounds are normal. There is no distension.     Palpations: Abdomen is soft.     Tenderness: There is abdominal tenderness (Mild lower abdominal tenderness). There is no guarding or rebound.  Skin:    General: Skin is warm and dry.  Neurological:     Mental Status: She is alert.      Assessment/Plan: Please see individual problem list.  Flu-like symptoms Patients symptoms are consistent with influenza and rapid flu test is positive.  We will treat for influenza with Tamiflu given her symptoms and history of asthma.  Discussed risk of psychiatric issues with the Tamiflu and if she develops any psychiatric symptoms she will discontinue the medication and be evaluated.  I suspect her abdominal discomfort is likely related to her prior diarrhea and her chronic abdominal pain.  She has mild tenderness though no additional abdominal exam findings.  Discussed monitoring and if not improving over the next couple of days letting us know.  We will refer to GI for chronic abdominal pain and  chronic intermittent diarrhea. Given return precautions.  Chronic abdominal pain Patient reports chronic abdominal pain history with chronic intermittent diarrhea.  She reports prior evaluation with GI for this.  We will refer back to GI to consider further evaluation.    Orders Placed This Encounter  Procedures  . Ambulatory referral to Gastroenterology    Referral Priority:   Routine    Referral Type:   Consultation    Referral Reason:   Specialty Services Required    Number of Visits Requested:   1  . POC Influenza A&B(BINAX/QUICKVUE)    Meds ordered this encounter  Medications  . oseltamivir (TAMIFLU) 75 MG capsule    Sig: Take 1 capsule (75 mg total) by mouth 2 (two) times daily.    Dispense:  10 capsule      Refill:  0     Marikay Alar, MD [facility]

## 2018-06-10 NOTE — Assessment & Plan Note (Addendum)
Patients symptoms are consistent with influenza and rapid flu test is positive.  We will treat for influenza with Tamiflu given her symptoms and history of asthma.  Discussed risk of psychiatric issues with the Tamiflu and if she develops any psychiatric symptoms she will discontinue the medication and be evaluated.  I suspect her abdominal discomfort is likely related to her prior diarrhea and her chronic abdominal pain.  She has mild tenderness though no additional abdominal exam findings.  Discussed monitoring and if not improving over the next couple of days letting us know.  We will refer to GI for chronic abdominal pain and chronic intermittent diarrhea. Given return precautions.

## 2018-06-10 NOTE — Assessment & Plan Note (Signed)
Patient reports chronic abdominal pain history with chronic intermittent diarrhea.  She reports prior evaluation with GI for this.  We will refer back to GI to consider further evaluation.

## 2018-06-11 ENCOUNTER — Ambulatory Visit: Payer: BLUE CROSS/BLUE SHIELD | Admitting: Family Medicine

## 2018-06-30 DIAGNOSIS — Z79899 Other long term (current) drug therapy: Secondary | ICD-10-CM | POA: Diagnosis not present

## 2018-07-03 ENCOUNTER — Encounter: Payer: Self-pay | Admitting: Gastroenterology

## 2018-07-04 ENCOUNTER — Ambulatory Visit (INDEPENDENT_AMBULATORY_CARE_PROVIDER_SITE_OTHER): Payer: BLUE CROSS/BLUE SHIELD | Admitting: Gastroenterology

## 2018-07-04 DIAGNOSIS — K219 Gastro-esophageal reflux disease without esophagitis: Secondary | ICD-10-CM

## 2018-07-04 DIAGNOSIS — R1084 Generalized abdominal pain: Secondary | ICD-10-CM

## 2018-07-04 DIAGNOSIS — K5900 Constipation, unspecified: Secondary | ICD-10-CM

## 2018-07-04 MED ORDER — FAMOTIDINE 20 MG PO TABS: 20.0000 mg | ORAL_TABLET | Freq: Every day | ORAL | 1 refills | Status: DC

## 2018-07-04 NOTE — Patient Instructions (Addendum)
H.Pylori lab ordered. Go to your nearest [facility] station ([address] off [address] between [place] and [address] or in [place], [facility], located behind the [address]). Bed wedge nightly, may purhase at [facility]. Pepcid was sent to [facility] with 1 refill. F/U clinic in 3 months.   Gastroesophageal Reflux Disease, Adult Gastroesophageal reflux (GER) happens when acid from the stomach flows up into the tube that connects the mouth and the stomach (esophagus). Normally, food travels down the esophagus and stays in the stomach to be digested. With GER, food and stomach acid sometimes move back up into the esophagus. You may have a disease called gastroesophageal reflux disease (GERD) if the reflux:  Happens often.  Causes frequent or very bad symptoms.  Causes problems such as damage to the esophagus. When this happens, the esophagus becomes sore and swollen (inflamed). Over time, GERD can make small holes (ulcers) in the lining of the esophagus. What are the causes? This condition is caused by a problem with the muscle between the esophagus and the stomach. When this muscle is weak or not normal, it does not close properly to keep food and acid from coming back up from the stomach. The muscle can be weak because of:  Tobacco use.  Pregnancy.  Having a certain type of hernia (hiatal hernia).  Alcohol use.  Certain foods and drinks, such as coffee, chocolate, onions, and peppermint. What increases the risk? You are more likely to develop this condition if you:  Are overweight.  Have a disease that affects your connective tissue.  Use NSAID medicines. What are the signs or symptoms? Symptoms of this condition include:  Heartburn.  Difficult or painful swallowing.  The feeling of having a lump in the throat.  A bitter taste in the mouth.  Bad breath.  Having a lot of saliva.  Having an upset or bloated stomach.  Belching.  Chest pain. Different  conditions can cause chest pain. Make sure you see your doctor if you have chest pain.  Shortness of breath or noisy breathing (wheezing).  Ongoing (chronic) cough or a cough at night.  Wearing away of the surface of teeth (tooth enamel).  Weight loss. How is this treated? Treatment will depend on how bad your symptoms are. Your doctor may suggest:  Changes to your diet.  Medicine.  Surgery. Follow these instructions at home: Eating and drinking   Follow a diet as told by your doctor. You may need to avoid foods and drinks such as: ? Coffee and tea (with or without caffeine). ? Drinks that contain alcohol. ? Energy drinks and sports drinks. ? Bubbly (carbonated) drinks or sodas. ? Chocolate and cocoa. ? Peppermint and mint flavorings. ? Garlic and onions. ? Horseradish. ? Spicy and acidic foods. These include peppers, chili powder, curry powder, vinegar, hot sauces, and BBQ sauce. ? Citrus fruit juices and citrus fruits, such as oranges, lemons, and limes. ? Tomato-based foods. These include red sauce, chili, salsa, and pizza with red sauce. ? Fried and fatty foods. These include donuts, french fries, potato chips, and high-fat dressings. ? High-fat meats. These include hot dogs, rib eye steak, sausage, ham, and bacon. ? High-fat dairy items, such as whole milk, butter, and cream cheese.  Eat small meals often. Avoid eating large meals.  Avoid drinking large amounts of liquid with your meals.  Avoid eating meals during the 2-3 hours before bedtime.  Avoid lying down right after you eat.  Do not exercise right after  you eat. Lifestyle   Do not use any products that contain nicotine or tobacco. These include cigarettes, e-cigarettes, and chewing tobacco. If you need help quitting, ask your doctor.  Try to lower your stress. If you need help doing this, ask your doctor.  If you are overweight, lose an amount of weight that is healthy for you. Ask your doctor about a  safe weight loss goal. General instructions  Pay attention to any changes in your symptoms.  Take over-the-counter and prescription medicines only as told by your doctor. Do not take aspirin, ibuprofen, or other NSAIDs unless your doctor says it is okay.  Wear loose clothes. Do not wear anything tight around your waist.  Raise (elevate) the head of your bed about 6 inches (15 cm).  Avoid bending over if this makes your symptoms worse.  Keep all follow-up visits as told by your doctor. This is important. Contact a doctor if:  You have new symptoms.  You lose weight and you do not know why.  You have trouble swallowing or it hurts to swallow.  You have wheezing or a cough that keeps happening.  Your symptoms do not get better with treatment.  You have a hoarse voice. Get help right away if:  You have pain in your arms, neck, jaw, teeth, or back.  You feel sweaty, dizzy, or light-headed.  You have chest pain or shortness of breath.  You throw up (vomit) and your throw-up looks like blood or coffee grounds.  You pass out (faint).  Your poop (stool) is bloody or black.  You cannot swallow, drink, or eat. Summary  If a person has gastroesophageal reflux disease (GERD), food and stomach acid move back up into the esophagus and cause symptoms or problems such as damage to the esophagus.  Treatment will depend on how bad your symptoms are.  Follow a diet as told by your doctor.  Take all medicines only as told by your doctor. This information is not intended to replace advice given to you by your health care provider. Make sure you discuss any questions you have with your health care provider. Document Released: 09/05/2007 Document Revised: 09/25/2017 Document Reviewed: 09/25/2017 Elsevier Interactive Patient Education  2019 [employer].

## 2018-07-04 NOTE — Progress Notes (Signed)
Crystal Barrett [address]  [address]  [place], [place] [address]  Main: [phone]  Fax: [phone]   Gastroenterology Consultation  Referring Provider:     Dr. Birdie Sons Primary Care Physician:  Sherlene Shams, MD Reason for Consultation:    Abdominal Pain        HPI:   Virtual Visit via Video Note  I connected with patient on 07/04/18 at 11:00 AM EDT by video ([email or web address]) and verified that I am speaking with the correct person using two identifiers.   I discussed the limitations, risks, security and privacy concerns of performing an evaluation and management service by video and the availability of in person appointments. I also discussed with the patient that there may be a patient responsible charge related to this service. The patient expressed understanding and agreed to proceed.  Location of the patient: In her car Location of provider: Home Participating persons: Patient and provider only   History of Present Illness: CC: Abdominal Pain  Crystal Barrett is a 63 y.o. y/o female referred for consultation & management  by Dr. Sherlene Shams, MD.  Patient reports chronic history of abdominal pain, at least for the past 6 months.  She states she wakes up with it.  Bilateral lower quadrant, improved after a bowel movement.  Reports bowel movements vary, from constipation to loose stools.  Sometimes will have 1 bowel movement days, sometimes 3.  No incontinence.  They are not watery.  No blood in stool.  No weight loss.  No nausea or vomiting.  No dysphagia.  No family history of colon cancer.  Patient had a colonoscopy in 2013 for screening which was normal except diverticulosis.  Repeat was recommended in 10 years, it was done by Dr. Bluford Kaufmann.  She reports history of upper endoscopy over 20 years ago for acid reflux and does not know the results, but as far she knows it was normal.  Patient has been taking omeprazole for 20 years and controls her acid reflux well.   States after she misses the medication for 2 days she has significant symptoms and so has stayed on the medication for years.  Past Medical History:  Diagnosis Date  . Acid reflux   . Allergy   . Chicken pox   . Eczema   . Osteopenia     Past Surgical History:  Procedure Laterality Date  . COLONOSCOPY  2010   Diverticulitis. No polyps.   . INNER EAR SURGERY    . UPPER GASTROINTESTINAL ENDOSCOPY  2000    Prior to Admission medications   Medication Sig Start Date End Date Taking? Authorizing Provider  folic acid (FOLVITE) 1 MG tablet Take 5mg  on non-MTX days 02/20/18  Yes [provider]  methotrexate (RHEUMATREX) 10 MG tablet Take 10 mg once a week by mouth. Caution: Chemotherapy. Protect from light.   Yes [provider]  traZODone (DESYREL) 150 MG tablet TAKE 1 TABLET AT BEDTIME 12/09/17  Yes Defrancesco, Prentice Docker, MD    Family History  Problem Relation Age of Onset  . Heart disease Mother   . Ovarian cancer Maternal Aunt   . Diabetes Daughter   . Breast cancer Neg Hx   . Colon cancer Neg Hx      Social History   Tobacco Use  . Smoking status: Former Smoker    Packs/day: 0.50    Types: Cigarettes    Last attempt to quit: 07/05/2006    Years since quitting: 12.0  .  Smokeless tobacco: Never Used  Substance Use Topics  . Alcohol use: Yes    Alcohol/week: 0.0 standard drinks    Comment: occas  . Drug use: No    Allergies as of 07/04/2018 - Review Complete 07/03/2018  Allergen Reaction Noted  . Codeine Nausea And Vomiting 01/25/2014    Review of Systems:    All systems reviewed and negative except where noted in HPI.   Observations/Objective:  Labs: Care everywhere labs reviewed.  Normal hemoglobin from March 2020.  Normal  AST and ALT in March 2020 Imaging Studies: No results found.  Assessment and Plan:   [name] is a 63 y.o. y/o female has been referred for abdominal pain  Assessment and Plan: Patient's abdominal pain is  chronic, without any alarm symptoms.  It improves with bowel movements, and therefore is likely related to underlying constipation. High-fiber diet Metamucil daily with goal of 1-2 soft bowel movements daily.  If not at goal, patient instructed to increase dose to twice daily.  If loose stools with the medication, patient asked to decrease the medication to every other day, or half dose daily.  Patient verbalized understanding The Metamucil will help bulk her loose stools and also help with her constipation.  Patient educated extensively on acid reflux lifestyle modification, including buying a bed wedge, not eating 3 hrs before bedtime, diet modifications, and handout given for the same.   We discussed adverse effects associated with omeprazole use and she is willing to try Pepcid instead.  This will be sent to her pharmacy and I have asked her to call us to let us know how her symptoms are doing in 2 weeks.  We may need to increase it to twice a day depending on how she does.  She was asked to completely discontinue the omeprazole while she is taking the Pepcid and she verbalized understanding.  (Risks of PPI use were discussed with patient including bone loss, C. Diff diarrhea, pneumonia, infections, CKD, electrolyte abnormalities.  If clinically possible based on symptoms, goal would be to maintain patient on the lowest dose possible, or discontinue the medication with institution of acid reflux lifestyle modifications over time. Pt. Verbalizes understanding and chooses to continue the medication.)  We discussed that given her chronic symptoms of reflux and inability to get off PPI, an upper endoscopy would help [place] rule out Barrett's and she is willing to schedule this at a later time.  Due to the current COVID-19 situation, elective procedures are currently being scheduled for a later time as per nationwide recommendations. Therefore, the patient was informed of the need to schedule the procedure  in the upcoming months, instead of sooner, since this is an elective procedure. Patient will need to be contacted at a later time to place him/her on the schedule. However, alarm symptoms were discussed in detail, and if these occur pt was advised to call us to discuss change in symptoms and evaluation for a more urgent procedure if appropriate. No indication for urgent procedure exist at this time. Patient was given the contact information to reach [place] with any questions, concerns, or change in symptoms.    Follow Up Instructions: Clinic follow-up in 3 months or earlier if needed Patient to call us with change or improvement in symptoms or with any questions or concerns  I discussed the assessment and treatment plan with the patient. The patient was provided an opportunity to ask questions and all were answered. The patient agreed with the plan and  demonstrated an understanding of the instructions.   The patient was advised to call back or seek an in-person evaluation if the symptoms worsen or if the condition fails to improve as anticipated.  I provided 15 minutes of non-face-to-face time during this encounter.   Pasty Spillers, MD  Speech recognition software was used to dictate the above note.

## 2018-07-07 ENCOUNTER — Telehealth: Payer: Self-pay | Admitting: Gastroenterology

## 2018-07-07 NOTE — Telephone Encounter (Signed)
Pt is calling to inform Dr. Maximino Greenland the rx Famotidene 20 mg is not working she does not need the 3 month supply she is back on the rx omeprazole. Also she does not feel comfortable [facility] is trying to stay away  Due to the virus.

## 2018-07-16 ENCOUNTER — Ambulatory Visit: Payer: BLUE CROSS/BLUE SHIELD | Admitting: Gastroenterology

## 2018-07-17 DIAGNOSIS — R1084 Generalized abdominal pain: Secondary | ICD-10-CM | POA: Diagnosis not present

## 2018-07-19 LAB — H PYLORI, IGM, IGG, IGA AB
H pylori, IgM Abs: <9 units (ref 0.0–8.9)
H. pylori, IgA Abs: 15.3 units — ABNORMAL HIGH (ref 0.0–8.9)
H. pylori, IgG AbS: 0.43 Index Value (ref 0.00–0.79)

## 2018-07-21 MED ORDER — CLARITHROMYCIN 250 MG PO TABS: 500.0000 mg | ORAL_TABLET | Freq: Two times a day (BID) | ORAL | 0 refills | Status: AC

## 2018-07-21 MED ORDER — OMEPRAZOLE 20 MG PO CPDR: 20.0000 mg | DELAYED_RELEASE_CAPSULE | Freq: Every day | ORAL | 0 refills | Status: DC

## 2018-07-21 MED ORDER — AMOXICILLIN 500 MG PO TABS: 1000.0000 mg | ORAL_TABLET | Freq: Two times a day (BID) | ORAL | 0 refills | Status: AC

## 2018-07-21 NOTE — Addendum Note (Signed)
Addended by: [name] on: 07/21/2018 09:39 AM   Modules accepted: Orders

## 2018-07-22 ENCOUNTER — Other Ambulatory Visit: Payer: Self-pay

## 2018-07-22 ENCOUNTER — Ambulatory Visit (INDEPENDENT_AMBULATORY_CARE_PROVIDER_SITE_OTHER): Payer: BLUE CROSS/BLUE SHIELD | Admitting: Internal Medicine

## 2018-07-22 DIAGNOSIS — E785 Hyperlipidemia, unspecified: Secondary | ICD-10-CM | POA: Diagnosis not present

## 2018-07-22 DIAGNOSIS — L309 Dermatitis, unspecified: Secondary | ICD-10-CM | POA: Diagnosis not present

## 2018-07-22 DIAGNOSIS — Z79899 Other long term (current) drug therapy: Secondary | ICD-10-CM

## 2018-07-22 DIAGNOSIS — K2950 Unspecified chronic gastritis without bleeding: Secondary | ICD-10-CM | POA: Diagnosis not present

## 2018-07-22 DIAGNOSIS — B9681 Helicobacter pylori [H. pylori] as the cause of diseases classified elsewhere: Secondary | ICD-10-CM

## 2018-07-22 NOTE — Progress Notes (Signed)
Virtual Visit via [email or web address]  This visit type was conducted due to national recommendations for restrictions regarding the COVID-19 pandemic (e.g. social distancing).  This format is felt to be most appropriate for this patient at this time.  All issues noted in this document were discussed and addressed.  No physical exam was performed (except for noted visual exam findings with Video Visits).   I connected with@ on 07/23/18 at  3:30 PM EDT by a video enabled telemedicine application  and verified that I am speaking with the correct person using two identifiers. Location patient: home Location provider: work or home office Persons participating in the virtual visit: patient, provider  I discussed the limitations, risks, security and privacy concerns of performing an evaluation and management service by telephone and the availability of in person appointments. I also discussed with the patient that there may be a patient responsible charge related to this service. The patient expressed understanding and agreed to proceed.  Reason for visit: reestablish care.  Last seen Sept 2017  HPI:  Treated for Influenza A by ES in early March and referred to GI by ES for chronic abdominal pai nof 6 months duration  in early March , tested for H Pylori and c urrently under  treatment for H Pylori    Saw defrancesco for gyn and last PAP normal Oct 2017 FOBTs have been  negative annually x 3, Nov 2018 was  last one    normal Mammogram  Nov 2018   Taking MTX prescribed by  [facility] for dermatitis.   Needs q 6 month labs in September.   Recently  done by [facility] March 2020    ROS: See pertinent positives and negatives per HPI.  Past Medical History:  Diagnosis Date  . Acid reflux   . Allergy   . Chicken pox   . Eczema   . Osteopenia     Past Surgical History:  Procedure Laterality Date  . COLONOSCOPY  2010   Diverticulitis. No polyps.   . INNER EAR SURGERY    . UPPER GASTROINTESTINAL ENDOSCOPY  2000     Family History  Problem Relation Age of Onset  . Heart disease Mother   . Ovarian cancer Maternal Aunt   . Diabetes Daughter   . Breast cancer Neg Hx   . Colon cancer Neg Hx     SOCIAL HX:  Social History   Tobacco Use  . Smoking status: Former Smoker    Packs/day: 0.50    Types: Cigarettes    Last attempt to quit: 07/05/2006    Years since quitting: 12.0  . Smokeless tobacco: Never Used  Substance Use Topics  . Alcohol use: Yes    Alcohol/week: 0.0 standard drinks    Comment: occas  . Drug use: No    Current Outpatient Medications:  .  amoxicillin (AMOXIL) 500 MG tablet, Take 2 tablets (1,000 mg total) by mouth 2 (two) times daily for 14 days., Disp: 56 tablet, Rfl: 0 .  clarithromycin (BIAXIN) 250 MG tablet, Take 2 tablets (500 mg total) by mouth 2 (two) times daily for 14 days., Disp: 56 tablet, Rfl: 0 .  fluticasone (FLONASE) 50 MCG/ACT nasal spray, , Disp: , Rfl:  .  folic acid (FOLVITE) 1 MG tablet, Take 5mg  on non-MTX days, Disp: , Rfl:  .  LOTEMAX SM 0.38 % GEL, , Disp: , Rfl:  .  methotrexate (RHEUMATREX) 10 MG tablet, Take 10 mg once a week by mouth. Caution: Chemotherapy. Protect from  light., Disp: , Rfl:  .  omeprazole (PRILOSEC) 20 MG capsule, Take 1 capsule (20 mg total) by mouth daily for 14 days., Disp: 14 capsule, Rfl: 0 .  traZODone (DESYREL) 150 MG tablet, TAKE 1 TABLET AT BEDTIME, Disp: 60 tablet, Rfl: 6  EXAM:  VITALS per patient if applicable:  GENERAL: alert, oriented, appears well and in no acute distress  HEENT: atraumatic, conjunttiva clear, no obvious abnormalities on inspection of external nose and ears  NECK: normal movements of the head and neck  LUNGS: on inspection no signs of respiratory distress, breathing rate appears normal, no obvious gross SOB, gasping or wheezing  CV: no obvious cyanosis  MS: moves all visible extremities without noticeable abnormality  PSYCH/NEURO: pleasant and cooperative, no obvious depression or  anxiety, speech and thought processing grossly intact  ASSESSMENT AND PLAN:  Discussed the following assessment and plan:  Chronic Helicobacter pylori gastritis Currently under triple therapy by GI.  EGD planned if pain does not improve   Dermatitis Managed with MTX.  surveillance labs are normal    Updated Medication List Outpatient Encounter Medications as of 07/22/2018  Medication Sig  . amoxicillin (AMOXIL) 500 MG tablet Take 2 tablets (1,000 mg total) by mouth 2 (two) times daily for 14 days.  . clarithromycin (BIAXIN) 250 MG tablet Take 2 tablets (500 mg total) by mouth 2 (two) times daily for 14 days.  . fluticasone (FLONASE) 50 MCG/ACT nasal spray   . folic acid (FOLVITE) 1 MG tablet Take 5mg  on non-MTX days  . LOTEMAX SM 0.38 % GEL   . methotrexate (RHEUMATREX) 10 MG tablet Take 10 mg once a week by mouth. Caution: Chemotherapy. Protect from light.  Marland Kitchen. omeprazole (PRILOSEC) 20 MG capsule Take 1 capsule (20 mg total) by mouth daily for 14 days.  . traZODone (DESYREL) 150 MG tablet TAKE 1 TABLET AT BEDTIME   No facility-administered encounter medications on file as of 07/22/2018.       I discussed the assessment and treatment plan with the patient. The patient was provided an opportunity to ask questions and all were answered. The patient agreed with the plan and demonstrated an understanding of the instructions.   The patient was advised to call back or seek an in-person evaluation if the symptoms worsen or if the condition fails to improve as anticipated.  I provided  25 minutes of non-face-to-face time during this encounter.   Sherlene Shamseresa L Shalana Jardin, MD

## 2018-07-23 DIAGNOSIS — K2950 Unspecified chronic gastritis without bleeding: Secondary | ICD-10-CM

## 2018-07-23 DIAGNOSIS — B9681 Helicobacter pylori [H. pylori] as the cause of diseases classified elsewhere: Secondary | ICD-10-CM | POA: Insufficient documentation

## 2018-07-23 NOTE — Assessment & Plan Note (Signed)
Managed with MTX.  surveillance labs are normal

## 2018-07-23 NOTE — Assessment & Plan Note (Signed)
Currently under triple therapy by GI.  EGD planned if pain does not improve

## 2018-07-30 ENCOUNTER — Ambulatory Visit: Payer: BLUE CROSS/BLUE SHIELD | Admitting: Gastroenterology

## 2018-08-27 ENCOUNTER — Ambulatory Visit: Payer: BLUE CROSS/BLUE SHIELD | Admitting: Gastroenterology

## 2018-09-29 DIAGNOSIS — L570 Actinic keratosis: Secondary | ICD-10-CM | POA: Diagnosis not present

## 2018-10-08 ENCOUNTER — Ambulatory Visit: Payer: BLUE CROSS/BLUE SHIELD | Admitting: Gastroenterology

## 2018-11-04 ENCOUNTER — Ambulatory Visit: Payer: BLUE CROSS/BLUE SHIELD | Admitting: Gastroenterology

## 2018-12-23 ENCOUNTER — Telehealth: Payer: Self-pay | Admitting: Internal Medicine

## 2018-12-23 NOTE — Telephone Encounter (Signed)
She states that she will try azo for now since she can not come in to do a urine sample today. She will let us know if it has not gotten better so she can set up an appointment and urine sample.

## 2018-12-23 NOTE — Telephone Encounter (Signed)
Please handle.  I do not call in abx without a urine specimen and telephone visit which can be done today

## 2018-12-23 NOTE — Telephone Encounter (Signed)
Pt wants to know if Dr. Derrel Nip can call in a rx for a UTI. She states that she has the symptoms-sensation of having to use the restroom constantly and pain when she does use the restroom. [facility] [address] [phone]

## 2019-01-07 ENCOUNTER — Other Ambulatory Visit: Payer: Self-pay | Admitting: Internal Medicine

## 2019-01-07 DIAGNOSIS — Z1231 Encounter for screening mammogram for malignant neoplasm of breast: Secondary | ICD-10-CM

## 2019-01-15 ENCOUNTER — Ambulatory Visit
Admission: RE | Admit: 2019-01-15 | Discharge: 2019-01-15 | Disposition: A | Discharge status: Home / Self Care | Payer: BC Managed Care – PPO | Source: Ambulatory Visit | Attending: Internal Medicine | Admitting: Internal Medicine

## 2019-01-15 ENCOUNTER — Other Ambulatory Visit: Payer: Self-pay

## 2019-01-15 DIAGNOSIS — Z1231 Encounter for screening mammogram for malignant neoplasm of breast: Secondary | ICD-10-CM | POA: Insufficient documentation

## 2019-01-16 ENCOUNTER — Encounter: Payer: BLUE CROSS/BLUE SHIELD | Admitting: Internal Medicine

## 2019-01-21 ENCOUNTER — Other Ambulatory Visit: Payer: Self-pay | Admitting: Internal Medicine

## 2019-01-21 NOTE — Telephone Encounter (Signed)
Requested medication (s) are due for refill today: yes  Requested medication (s) are on the active medication list: yes  Last refill:12/2017  Future visit scheduled: yes  Notes to clinic:  Last filled by different provider   Requested Prescriptions  Pending Prescriptions Disp Refills   traZODone (DESYREL) 150 MG tablet 60 tablet 6    Sig: Take 1 tablet (150 mg total) by mouth at bedtime.     Psychiatry: Antidepressants - Serotonin Modulator Failed - 01/21/2019 12:52 PM      Failed - Valid encounter within last 6 months    Recent Outpatient Visits          6 months ago Long-term use of high-risk medication   [place], MD   7 months ago Chronic abdominal pain   [facility] [place], MD   3 years ago [place] Crecencio Mc, MD   3 years ago Encounter for immunization   [place], MD      Future Appointments            In 1 month Derrel Nip Aris Everts, MD [place]           Failed - Completed PHQ-2 or PHQ-9 in the last 360 days.

## 2019-01-21 NOTE — Telephone Encounter (Signed)
traZODone (DESYREL) 150 MG tablet  [facility]

## 2019-01-23 MED ORDER — TRAZODONE HCL 150 MG PO TABS: 150.0000 mg | ORAL_TABLET | Freq: Every day | ORAL | 6 refills | Status: DC

## 2019-02-23 DIAGNOSIS — R198 Other specified symptoms and signs involving the digestive system and abdomen: Secondary | ICD-10-CM | POA: Diagnosis not present

## 2019-02-23 DIAGNOSIS — R1032 Left lower quadrant pain: Secondary | ICD-10-CM | POA: Diagnosis not present

## 2019-02-23 DIAGNOSIS — R1031 Right lower quadrant pain: Secondary | ICD-10-CM | POA: Diagnosis not present

## 2019-02-24 ENCOUNTER — Other Ambulatory Visit: Payer: Self-pay

## 2019-03-03 ENCOUNTER — Other Ambulatory Visit: Payer: Self-pay

## 2019-03-03 ENCOUNTER — Encounter: Payer: Self-pay | Admitting: Internal Medicine

## 2019-03-03 ENCOUNTER — Ambulatory Visit (INDEPENDENT_AMBULATORY_CARE_PROVIDER_SITE_OTHER): Payer: BC Managed Care – PPO | Admitting: Internal Medicine

## 2019-03-03 VITALS — BP 110/80 | HR 69 | Temp 97.1°F | Ht 65.0 in | Wt 125.8 lb

## 2019-03-03 DIAGNOSIS — R634 Abnormal weight loss: Secondary | ICD-10-CM | POA: Diagnosis not present

## 2019-03-03 DIAGNOSIS — Z0000 Encounter for general adult medical examination without abnormal findings: Secondary | ICD-10-CM | POA: Diagnosis not present

## 2019-03-03 DIAGNOSIS — E782 Mixed hyperlipidemia: Secondary | ICD-10-CM | POA: Diagnosis not present

## 2019-03-03 DIAGNOSIS — Z113 Encounter for screening for infections with a predominantly sexual mode of transmission: Secondary | ICD-10-CM | POA: Diagnosis not present

## 2019-03-03 DIAGNOSIS — Z124 Encounter for screening for malignant neoplasm of cervix: Secondary | ICD-10-CM | POA: Diagnosis not present

## 2019-03-03 DIAGNOSIS — Z23 Encounter for immunization: Secondary | ICD-10-CM

## 2019-03-03 NOTE — Assessment & Plan Note (Signed)
Checking TSH,

## 2019-03-03 NOTE — Assessment & Plan Note (Signed)

## 2019-03-03 NOTE — Progress Notes (Signed)
Patient ID: Crystal Barrett, female    DOB: 1955/07/24  Age: 63 y.o. MRN: [id number]  The patient is here for annual preventive  examination and management of other chronic and acute problems.  This visit occurred during the SARS-CoV-2 public health emergency.  Safety protocols were in place, including screening questions prior to the visit, additional usage of staff PPE, and extensive cleaning of exam room while observing appropriate contact time as indicated for disinfecting solutions.     The risk factors are reflected in the social history.  The roster of all physicians providing medical care to patient - is listed in the Snapshot section of the chart.  Activities of daily living:  The patient is 100% independent in all ADLs: dressing, toileting, feeding as well as independent mobility  Home safety : The patient has smoke detectors in the home. They wear seatbelts.  There are no firearms at home. There is no violence in the home.   There is no risks for hepatitis, STDs or HIV. There is no   history of blood transfusion. They have no travel history to infectious disease endemic areas of the world.  The patient has seen their dentist in the last six month. They have seen their eye doctor in the last year. They admit to slight hearing difficulty with regard to whispered voices and some television programs.  They have deferred audiologic testing in the last year.  They do not  have excessive sun exposure. Discussed the need for sun protection: hats, long sleeves and use of sunscreen if there is significant sun exposure.   Diet: the importance of a healthy diet is discussed. They do have a healthy diet.  The benefits of regular aerobic exercise were discussed. She walks 4 times per week ,  20 minutes.   Depression screen: there are no signs or vegative symptoms of depression- irritability, change in appetite, anhedonia, sadness/tearfullness.  Cognitive assessment: the patient manages all  their financial and personal affairs and is actively engaged. They could relate day,date,year and events; recalled 2/3 objects at 3 minutes; performed clock-face test normally.  The following portions of the patient's history were reviewed and updated as appropriate: allergies, current medications, past family history, past medical history,  past surgical history, past social history  and problem list.  Visual acuity was not assessed per patient preference since she has regular follow up with her ophthalmologist. Hearing and body mass index were assessed and reviewed.   During the course of the visit the patient was educated and counseled about appropriate screening and preventive services including : fall prevention , diabetes screening, nutrition counseling, colorectal cancer screening, and recommended immunizations.    CC: The primary encounter diagnosis was Mixed hyperlipidemia. Diagnoses of Abnormal weight loss, Screen for STD (sexually transmitted disease), Cervical cancer screening, Need for immunization against influenza, and Encounter for preventive health examination were also pertinent to this visit.  No acute complaints  Saw GI for abd pain/diarrhea/weight loss  2/2.gastritis.  With unintentional wt loss.  Workup with stools studies in progress ,, symptoms improved with Levsin    History Crystal Barrett has a past medical history of Acid reflux, Allergy, Chicken pox, Eczema, and Osteopenia.   She has a past surgical history that includes Inner ear surgery; Colonoscopy (2010); and Upper gastrointestinal endoscopy (2000).   Her family history includes Diabetes in her daughter; Heart disease in her mother; Ovarian cancer in her maternal aunt.She reports that she quit smoking about 12 years ago. Her smoking use  included cigarettes. She smoked 0.50 packs per day. She has never used smokeless tobacco. She reports current alcohol use. She reports that she does not use drugs.  Outpatient Medications  Prior to Visit  Medication Sig Dispense Refill  . methotrexate (RHEUMATREX) 10 MG tablet Take 10 mg once a week by mouth. Caution: Chemotherapy. Protect from light.    . traZODone (DESYREL) 150 MG tablet Take 1 tablet (150 mg total) by mouth at bedtime. 60 tablet 6  . fluticasone (FLONASE) 50 MCG/ACT nasal spray     . folic acid (FOLVITE) 1 MG tablet Take  on non-MTX days    . LOTEMAX SM 0.38 % GEL     . omeprazole (PRILOSEC) 20 MG capsule Take 1 capsule (20 mg total) by mouth daily for 14 days. 14 capsule 0   No facility-administered medications prior to visit.     Review of Systems   Patient denies headache, fevers, malaise, unintentional weight loss, skin rash, eye pain, sinus congestion and sinus pain, sore throat, dysphagia,  hemoptysis , cough, dyspnea, wheezing, chest pain, palpitations, orthopnea, edema, abdominal pain, nausea, melena, diarrhea, constipation, flank pain, dysuria, hematuria, urinary  Frequency, nocturia, numbness, tingling, seizures,  Focal weakness, Loss of consciousness,  Tremor, insomnia, depression, anxiety, and suicidal ideation.     Objective:  BP 110/80   Pulse 69   Temp (!) 97.1 F (36.2 C) (Temporal)   Ht  (1.651 m)   Wt 125 lb 12.8 oz (57.1 kg)   SpO2 97%   BMI 20.93 kg/m   Physical Exam  General Appearance:    Alert, cooperative, no distress, appears stated age  Head:    Normocephalic, without obvious abnormality, atraumatic  Eyes:    PERRL, conjunctiva/corneas clear, EOM's intact, fundi    benign, both eyes  Ears:    Normal TM's and external ear canals, both ears  Nose:   Nares normal, septum midline, mucosa normal, no drainage    or sinus tenderness  Throat:   Lips, mucosa, and tongue normal; teeth and gums normal  Neck:   Supple, symmetrical, trachea midline, no adenopathy;    thyroid:  no enlargement/tenderness/nodules; no carotid   bruit or JVD  Back:     Symmetric, no curvature, ROM normal, no CVA tenderness  Lungs:     Clear  to auscultation bilaterally, respirations unlabored  Chest Wall:    No tenderness or deformity   Heart:    Regular rate and rhythm, S1 and S2 normal, no murmur, rub   or gallop  Breast Exam:    No tenderness, masses, or nipple abnormality  Abdomen:     Soft, non-tender, bowel sounds active all four quadrants,    no masses, no organomegaly  Genitalia:    Pelvic: cervix normal in appearance, external genitalia normal, no adnexal masses or tenderness, no cervical motion tenderness, rectovaginal septum normal, uterus normal size, shape, and consistency and vagina normal without discharge  Extremities:   Extremities normal, atraumatic, no cyanosis or edema  Pulses:   2+ and symmetric all extremities  Skin:   Skin color, texture, turgor normal, no rashes or lesions  Lymph nodes:   Cervical, supraclavicular, and axillary nodes normal  Neurologic:   CNII-XII intact, normal strength, sensation and reflexes    throughout     Assessment & Plan:   Problem List Items Addressed This Visit      Unprioritized   Abnormal weight loss    Checking TSH,  Relevant Orders   Comprehensive metabolic panel   TSH   Encounter for preventive health examination    age appropriate education and counseling updated, referrals for preventative services and immunizations addressed, dietary and smoking counseling addressed, most recent labs reviewed.  I have personally reviewed and have noted:  1) the patient's medical and social history 2) The pt's use of alcohol, tobacco, and illicit drugs 3) The patient's current medications and supplements 4) Functional ability including ADL's, fall risk, home safety risk, hearing and visual impairment 5) Diet and physical activities 6) Evidence for depression or mood disorder 7) The patient's height, weight, and BMI have been recorded in the chart  I have made referrals, and provided counseling and education based on review of the above       Other Visit  Diagnoses    Mixed hyperlipidemia    -  Primary   Relevant Orders   Lipid panel   Screen for STD (sexually transmitted disease)       Relevant Orders   Hepatitis C Antibody   HIV antibody   Cervical cancer screening       Relevant Orders   Cytology - PAP( [place])   Need for immunization against influenza       Relevant Orders   Flu Vaccine QUAD High Dose(Fluad) (Completed)      I am having Crystal Barrett maintain her methotrexate, folic acid, omeprazole, fluticasone, Lotemax SM, and traZODone.  No orders of the defined types were placed in this encounter.   There are no discontinued medications.  Follow-up: No follow-ups on file.   Crecencio Mc, MD

## 2019-03-03 NOTE — Patient Instructions (Signed)

## 2019-03-04 LAB — LIPID PANEL
Cholesterol: 185 mg/dL (ref 0–200)
HDL: 64.3 mg/dL (ref >39.00)
LDL Cholesterol: 101 mg/dL — ABNORMAL HIGH (ref 0–99)
NonHDL: 120.2
Total CHOL/HDL Ratio: 3
Triglycerides: 97 mg/dL (ref 0.0–149.0)
VLDL: 19.4 mg/dL (ref 0.0–40.0)

## 2019-03-04 LAB — COMPREHENSIVE METABOLIC PANEL
ALT: 8 U/L (ref 0–35)
AST: 12 U/L (ref 0–37)
Albumin: 4.2 g/dL (ref 3.5–5.2)
Alkaline Phosphatase: 98 U/L (ref 39–117)
BUN: 6 mg/dL (ref 6–23)
CO2: 29 mEq/L (ref 19–32)
Calcium: 9.2 mg/dL (ref 8.4–10.5)
Chloride: 102 mEq/L (ref 96–112)
Creatinine, Ser: 0.87 mg/dL (ref 0.40–1.20)
GFR: 65.73 mL/min (ref >60.00)
Glucose, Bld: 94 mg/dL (ref 70–99)
Potassium: 3.9 mEq/L (ref 3.5–5.1)
Sodium: 139 mEq/L (ref 135–145)
Total Bilirubin: 0.4 mg/dL (ref 0.2–1.2)
Total Protein: 6.6 g/dL (ref 6.0–8.3)

## 2019-03-04 LAB — HIV ANTIBODY (ROUTINE TESTING W REFLEX): HIV 1&2 Ab, 4th Generation: NONREACTIVE

## 2019-03-04 LAB — HEPATITIS C ANTIBODY
Hepatitis C Ab: NONREACTIVE
SIGNAL TO CUT-OFF: 0.01 (ref <1.00)

## 2019-03-04 LAB — TSH: TSH: 1.8 u[IU]/mL (ref 0.35–4.50)

## 2019-03-05 LAB — CYTOLOGY - PAP
Comment: NEGATIVE
Diagnosis: NEGATIVE
High risk HPV: NEGATIVE

## 2019-03-09 DIAGNOSIS — R198 Other specified symptoms and signs involving the digestive system and abdomen: Secondary | ICD-10-CM | POA: Diagnosis not present

## 2019-04-06 ENCOUNTER — Other Ambulatory Visit: Payer: Self-pay

## 2019-04-06 MED ORDER — TRAZODONE HCL 150 MG PO TABS: 150.0000 mg | ORAL_TABLET | Freq: Every day | ORAL | 1 refills | Status: DC

## 2019-04-06 MED ORDER — OMEPRAZOLE 20 MG PO CPDR: 20.0000 mg | DELAYED_RELEASE_CAPSULE | Freq: Every day | ORAL | 0 refills | Status: DC

## 2019-04-06 NOTE — Telephone Encounter (Signed)
Looks like this medication was prescribed by her GI doctor.   Last OV: 03/03/2019 Next OV: not scheduled

## 2019-04-13 ENCOUNTER — Telehealth: Payer: Self-pay | Admitting: Internal Medicine

## 2019-04-13 MED ORDER — OMEPRAZOLE 20 MG PO CPDR: 20.0000 mg | DELAYED_RELEASE_CAPSULE | Freq: Every day | ORAL | 1 refills | Status: DC

## 2019-04-13 NOTE — Telephone Encounter (Signed)
Thank you :)

## 2019-04-13 NOTE — Telephone Encounter (Signed)
Pt called stating that she got her refill of omeprazole but was only refilled for 14 pills. She states that she gets at 90 day supply. Please advise.

## 2019-04-13 NOTE — Telephone Encounter (Signed)
Not sure why this was only filled for 14 days but it has been resent to [facility] for a 90 day supply.

## 2019-06-03 ENCOUNTER — Telehealth: Payer: Self-pay | Admitting: Internal Medicine

## 2019-06-03 DIAGNOSIS — Z720 Tobacco use: Secondary | ICD-10-CM | POA: Insufficient documentation

## 2019-06-03 NOTE — Assessment & Plan Note (Signed)
Starting chantix.

## 2019-06-03 NOTE — Telephone Encounter (Signed)
No appt  Needed  If she has taken it before,  If not needs appt

## 2019-06-03 NOTE — Telephone Encounter (Signed)
Pt called in and wants a prescription for Chantix to quit smoking. I let her know she may need an appt before we can send over medication. Please advise.

## 2019-06-03 NOTE — Telephone Encounter (Signed)
Does pt need an appt to discuss before being prescribed chantix?

## 2019-06-04 MED ORDER — CHANTIX STARTING MONTH PAK 0.5 MG X 11 & 1 MG X 42 PO TABS: ORAL_TABLET | ORAL | 0 refills | Status: DC

## 2019-06-04 NOTE — Telephone Encounter (Signed)
Pt stated that she took it about 8 years ago. Pt was in a hurry to get to another appt so she said that she would call back next week to schedule an appt with you. She said she would like prescription sent to [facility] in [place].

## 2019-06-04 NOTE — Telephone Encounter (Signed)
Starting month of chantix sent to [facility]

## 2019-06-05 NOTE — Telephone Encounter (Signed)
Spoke with pt to let her know that the starting month chantix has been sent to [facility] in [place]. Pt gave a verbal understanding.

## 2019-06-25 ENCOUNTER — Telehealth: Payer: Self-pay | Admitting: Internal Medicine

## 2019-06-25 MED ORDER — OMEPRAZOLE 40 MG PO CPDR: 40.0000 mg | DELAYED_RELEASE_CAPSULE | Freq: Every day | ORAL | 0 refills | Status: DC

## 2019-06-25 NOTE — Telephone Encounter (Signed)
Is it okay to change?

## 2019-06-25 NOTE — Telephone Encounter (Signed)
Dose increased.  Needs appt to discuss

## 2019-06-25 NOTE — Telephone Encounter (Signed)
Pt called she needs the omeprazole 20 mg changed  to 40mg   The 20mg  is not working for her

## 2019-06-25 NOTE — Addendum Note (Signed)
Addended by: Sherlene Shams on: 06/25/2019 09:50 PM   Modules accepted: Orders

## 2019-06-26 NOTE — Telephone Encounter (Signed)
I called patient to let her know dose was increased. Pt given first available appointment 4/19 @ 8 to discuss.

## 2019-07-20 ENCOUNTER — Ambulatory Visit: Payer: BC Managed Care – PPO | Admitting: Internal Medicine

## 2019-08-17 ENCOUNTER — Ambulatory Visit: Payer: BLUE CROSS/BLUE SHIELD | Admitting: Nurse Practitioner

## 2019-09-03 ENCOUNTER — Ambulatory Visit (INDEPENDENT_AMBULATORY_CARE_PROVIDER_SITE_OTHER): Payer: Self-pay | Admitting: Nurse Practitioner

## 2019-09-03 ENCOUNTER — Encounter: Payer: Self-pay | Admitting: Nurse Practitioner

## 2019-09-03 ENCOUNTER — Other Ambulatory Visit: Payer: Self-pay

## 2019-09-03 VITALS — BP 101/69 | HR 95 | Temp 98.2°F | Ht 65.25 in | Wt 114.8 lb

## 2019-09-03 DIAGNOSIS — G4700 Insomnia, unspecified: Secondary | ICD-10-CM

## 2019-09-03 DIAGNOSIS — K219 Gastro-esophageal reflux disease without esophagitis: Secondary | ICD-10-CM

## 2019-09-03 DIAGNOSIS — Z7689 Persons encountering health services in other specified circumstances: Secondary | ICD-10-CM

## 2019-09-03 MED ORDER — TRAZODONE HCL 150 MG PO TABS: 150.0000 mg | ORAL_TABLET | Freq: Every day | ORAL | 3 refills | Status: DC

## 2019-09-03 MED ORDER — OMEPRAZOLE 40 MG PO CPDR: 40.0000 mg | DELAYED_RELEASE_CAPSULE | Freq: Every day | ORAL | 3 refills | Status: DC

## 2019-09-03 NOTE — Patient Instructions (Signed)
It was very nice meeting you today!  Take care and please let me know if you need anything.  Food Choices for Gastroesophageal Reflux Disease, Adult When you have gastroesophageal reflux disease (GERD), the foods you eat and your eating habits are very important. Choosing the right foods can help ease your discomfort. Think about working with a nutrition specialist (dietitian) to help you make good choices. What are tips for following this plan?  Meals  Choose healthy foods that are low in fat, such as fruits, vegetables, whole grains, low-fat dairy products, and lean meat, fish, and poultry.  Eat small meals often instead of 3 large meals a day. Eat your meals slowly, and in a place where you are relaxed. Avoid bending over or lying down until 2-3 hours after eating.  Avoid eating meals 2-3 hours before bed.  Avoid drinking a lot of liquid with meals.  Cook foods using methods other than frying. Bake, grill, or broil food instead.  Avoid or limit: ? Chocolate. ? Peppermint or spearmint. ? Alcohol. ? Pepper. ? Black and decaffeinated coffee. ? Black and decaffeinated tea. ? Bubbly (carbonated) soft drinks. ? Caffeinated energy drinks and soft drinks.  Limit high-fat foods such as: ? Fatty meat or fried foods. ? Whole milk, cream, butter, or ice cream. ? Nuts and nut butters. ? Pastries, donuts, and sweets made with butter or shortening.  Avoid foods that cause symptoms. These foods may be different for everyone. Common foods that cause symptoms include: ? Tomatoes. ? Oranges, lemons, and limes. ? Peppers. ? Spicy food. ? Onions and garlic. ? Vinegar. Lifestyle  Maintain a healthy weight. Ask your doctor what weight is healthy for you. If you need to lose weight, work with your doctor to do so safely.  Exercise for at least 30 minutes for 5 or more days each week, or as told by your doctor.  Wear loose-fitting clothes.  Do not smoke. If you need help quitting, ask your  doctor.  Sleep with the head of your bed higher than your feet. Use a wedge under the mattress or blocks under the bed frame to raise the head of the bed. Summary  When you have gastroesophageal reflux disease (GERD), food and lifestyle choices are very important in easing your symptoms.  Eat small meals often instead of 3 large meals a day. Eat your meals slowly, and in a place where you are relaxed.  Limit high-fat foods such as fatty meat or fried foods.  Avoid bending over or lying down until 2-3 hours after eating.  Avoid peppermint and spearmint, caffeine, alcohol, and chocolate. This information is not intended to replace advice given to you by your health care provider. Make sure you discuss any questions you have with your health care provider. Document Revised: 07/10/2018 Document Reviewed: 04/24/2016 Elsevier Patient Education  2020 [employer].

## 2019-09-03 NOTE — Progress Notes (Addendum)
BP 101/69 (BP Location: Left Arm, Patient Position: Sitting, Cuff Size: Normal)    Pulse 95    Temp 98.2 F (36.8 C) (Oral)    Ht 5' 5.25" (1.657 m)    Wt 114 lb 12.8 oz (52.1 kg)    SpO2 98%    BMI 18.96 kg/m    Subjective:    Patient ID: Crystal Barrett, female    DOB: 1955/08/11, 64 y.o.   MRN: [id number]  HPI: Crystal Barrett is a 64 y.o. female presenting for new patient visit to establish care.  Introduced to Designer, jewellery role and practice setting.  All questions answered.  Discussed provider/patient relationship and expectations.   Chief Complaint  Patient presents with   Establish Care    Patient requesting refill on Omeprazole and Trazadone.    INSOMNIA Takes Trazodone 150 mg to help fall asleep at night for about 10 years. Duration: chronic Satisfied with sleep quality: yes Difficulty falling asleep: yes Difficulty staying asleep: no Waking a few hours after sleep onset: no Early morning awakenings: yes Daytime hypersomnolence: no Wakes feeling refreshed: yes Good sleep hygiene: no Apnea: no Snoring: no Depressed/anxious mood: no Recent stress: no Restless legs/nocturnal leg cramps: no Chronic pain/arthritis: no History of sleep study: no Treatments attempted:  Ambien, trazodone   GERD Omeprazole 40 mg daily for about 20 years.  Patient reports she did the "poop test" which may have showed some blockage but she has not been able to get a colonoscopy yet due to not having insurance. GERD control status: controlled  Satisfied with current treatment? yes Heartburn frequency: not at all Medication side effects: no  Medication compliance: excellent Previous GERD medications: rolaids, tums  Antacid use frequency:  never Duration:   Nature:  Location:  Heartburn duration:  Alleviatiating factors:   Aggravating factors:  Dysphagia: no Odynophagia:  no  Nausea/vomiting: no Hematemesis: no Blood in stool: no EGD: yes; patient reports 15 years ago  Dr. Jenetta Downer and reports they told her to take omeprazole daily  Allergies  Allergen Reactions   Codeine Nausea And Vomiting   Other Rash    Vinyl Gloves-Flares Eczema   Outpatient Encounter Medications as of 09/03/2019  Medication Sig   clobetasol cream (TEMOVATE) 1.74 % Apply 1 application topically 2 (two) times daily.   fluticasone (FLONASE) 50 MCG/ACT nasal spray    Multiple Vitamins-Minerals (ALIVE ONCE DAILY WOMENS 50+ PO) Take 1 Dose by mouth daily.   NON FORMULARY Take 1 Scoop by mouth daily. Collagen Peptide Powder   omeprazole (PRILOSEC) 40 MG capsule Take 1 capsule (40 mg total) by mouth daily.   traZODone (DESYREL) 150 MG tablet Take 1 tablet (150 mg total) by mouth at bedtime.   [DISCONTINUED] omeprazole (PRILOSEC) 40 MG capsule Take 1 capsule (40 mg total) by mouth daily.   [DISCONTINUED] traZODone (DESYREL) 150 MG tablet Take 1 tablet (150 mg total) by mouth at bedtime.   [DISCONTINUED] folic acid (FOLVITE) 1 MG tablet Take 5mg  on non-MTX days   [DISCONTINUED] LOTEMAX SM 0.38 % GEL    [DISCONTINUED] methotrexate (RHEUMATREX) 10 MG tablet Take 10 mg once a week by mouth. Caution: Chemotherapy. Protect from light.   [DISCONTINUED] varenicline (CHANTIX STARTING MONTH PAK) 0.5 MG X 11 & 1 MG X 42 tablet Take one 0.5 mg tablet by mouth once daily for 3 days, then increase to one 0.5 mg tablet twice daily for 4 days, then increase to one 1 mg tablet twice daily. (Patient not taking: Reported  on 09/03/2019)   No facility-administered encounter medications on file as of 09/03/2019.   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 No polyps.    INNER EAR SURGERY     UPPER GASTROINTESTINAL ENDOSCOPY  2000   WISDOM TOOTH EXTRACTION     Family History  Problem Relation Age of Onset   Heart disease Mother    Ovarian cancer Maternal Aunt    Diabetes Daughter    Breast cancer Neg Hx    Colon cancer Neg Hx    Social History   Tobacco Use   Smoking status: Former Smoker    Packs/day: 0.50    Types: Cigarettes    Quit date: 07/05/2006    Years since quitting: 13.1   Smokeless tobacco: Never Used  Substance Use Topics   Alcohol use: Yes    Alcohol/week: 0.0 standard drinks    Comment: Occassionally   Drug use: No   Review of Systems  Constitutional: Negative.  Negative for activity change, appetite change, fatigue and fever.  HENT: Negative.  Negative for trouble swallowing.   Respiratory: Negative.   Cardiovascular: Negative.   Gastrointestinal: Negative.  Negative for abdominal pain, blood in stool, constipation, diarrhea, nausea and vomiting.  Endocrine: Negative.   Neurological: Negative.   Psychiatric/Behavioral: Positive for sleep disturbance. Negative for behavioral problems, confusion, decreased concentration and hallucinations. The patient is not nervous/anxious and is not hyperactive.     Per HPI unless specifically indicated above     Objective:    BP 101/69 (BP Location: Left Arm, Patient Position: Sitting, Cuff Size: Normal)    Pulse 95    Temp 98.2 F (36.8 C) (Oral)    Ht 5' 5.25" (1.657 m)    Wt 114 lb 12.8 oz (52.1 kg)    SpO2  98%    BMI 18.96 kg/m   Wt Readings from Last 3 Encounters:  09/03/19 114 lb 12.8 oz (52.1 kg)  03/03/19 125 lb 12.8 oz (57.1 kg)  06/10/18 133 lb (60.3 kg)    Physical Exam Vitals and nursing note reviewed.  Constitutional:      General: She is not in acute distress.    Appearance: Normal appearance. She is not toxic-appearing.  HENT:     Head: Normocephalic and atraumatic.     Right Ear: External ear normal.     Left Ear: External ear normal.     Nose: Nose normal. No congestion.     Mouth/Throat:     Mouth: Mucous membranes are moist.     Pharynx: Oropharynx is clear.  Eyes:     General: No scleral icterus.    Extraocular Movements: Extraocular movements intact.  Cardiovascular:  Rate and Rhythm: Normal rate and regular rhythm.     Heart sounds: Normal heart sounds. No murmur.  Pulmonary:     Effort: Pulmonary effort is normal. No respiratory distress.     Breath sounds: Normal breath sounds. No wheezing, rhonchi or rales.  Abdominal:     General: Abdomen is flat. Bowel sounds are normal. There is no distension.     Palpations: Abdomen is soft. There is no mass.     Tenderness: There is no abdominal tenderness.  Musculoskeletal:        General: Normal range of motion.     Right lower leg: No edema.     Left lower leg: No edema.  Skin:    General: Skin is warm and dry.     Capillary Refill: Capillary refill takes less than 2 seconds.     Coloration: Skin is not jaundiced or pale.  Neurological:     General: No focal deficit present.     Mental Status: She is alert and oriented to person, place, and time.     Motor: No weakness.     Gait: Gait normal.  Psychiatric:        Mood and Affect: Mood normal.        Behavior: Behavior normal.        Thought Content: Thought content normal.        Judgment: Judgment normal.    Results for orders placed or performed in visit on 03/03/19  Hepatitis C Antibody  Result Value Ref Range   Hepatitis C Ab NON-REACTIVE  NON-REACTI   SIGNAL TO CUT-OFF 0.01 <1.00  HIV antibody  Result Value Ref Range   HIV 1&2 Ab, 4th Generation NON-REACTIVE NON-REACTI  Lipid panel  Result Value Ref Range   Cholesterol 185 0 - 200 mg/dL   Triglycerides 35.7 0.0 - 149.0 mg/dL   HDL 01.77 >93.90 mg/dL   VLDL 30.0 0.0 - 92.3 mg/dL   LDL Cholesterol 300 (H) 0 - 99 mg/dL   Total CHOL/HDL Ratio 3    NonHDL 120.20   Comprehensive metabolic panel  Result Value Ref Range   Sodium 139 135 - 145 mEq/L   Potassium 3.9 3.5 - 5.1 mEq/L   Chloride 102 96 - 112 mEq/L   CO2 29 19 - 32 mEq/L   Glucose, Bld 94 70 - 99 mg/dL   BUN 6 6 - 23 mg/dL   Creatinine, Ser 7.62 0.40 - 1.20 mg/dL   Total Bilirubin 0.4 0.2 - 1.2 mg/dL   Alkaline Phosphatase 98 39 - 117 U/L   AST 12 0 - 37 U/L   ALT 8 0 - 35 U/L   Total Protein 6.6 6.0 - 8.3 g/dL   Albumin 4.2 3.5 - 5.2 g/dL   GFR 26.33 >35.45 mL/min   Calcium 9.2 8.4 - 10.5 mg/dL  TSH  Result Value Ref Range   TSH 1.80 0.35 - 4.50 uIU/mL  Cytology - PAP( [place])  Result Value Ref Range   High risk HPV Negative    Adequacy      Satisfactory for evaluation; transformation zone component PRESENT.   Diagnosis      - Negative for intraepithelial lesion or malignancy (NILM)   Comment Normal Reference Range HPV - Negative       Assessment & Plan:   Problem List Items Addressed This Visit      Digestive   GERD (gastroesophageal reflux disease)    Chronic, stable.  Well-controlled on omeprazole 40 mg,  will continue for now.  Patient aware of side effects and risks associated with PPI.  Colonoscopy up to date.  With previous GI workup in April 2020, it looks like they recommended a repeat EGD to rule out Barrett's, the patient wishes to defer for now until she is able to obtain insurance.       Relevant Medications   omeprazole (PRILOSEC) 40 MG capsule     Other   Insomnia - Primary    Chronic, stable on Trazodone 150 mg nightly.  Will continue.  Patient to call with any  concerns.       Relevant Medications   traZODone (DESYREL) 150 MG tablet    Other Visit Diagnoses    Encounter to establish care           Follow up plan: Return if symptoms worsen or fail to improve.

## 2019-09-03 NOTE — Assessment & Plan Note (Signed)
Chronic, stable on Trazodone 150 mg nightly.  Will continue.  Patient to call with any concerns.

## 2019-09-03 NOTE — Assessment & Plan Note (Addendum)
Chronic, stable.  Well-controlled on omeprazole 40 mg, will continue for now.  Patient aware of side effects and risks associated with PPI.  Colonoscopy up to date.  With previous GI workup in April 2020, it looks like they recommended a repeat EGD to rule out Barrett's, the patient wishes to defer for now until she is able to obtain insurance.

## 2019-11-24 ENCOUNTER — Emergency Department
Admission: EM | Admit: 2019-11-24 | Discharge: 2019-11-24 | Discharge status: Absent without leave | Payer: BC Managed Care – PPO

## 2019-11-26 LAB — HM COLONOSCOPY

## 2019-11-27 ENCOUNTER — Other Ambulatory Visit: Payer: Self-pay | Admitting: Gastroenterology

## 2019-11-27 DIAGNOSIS — R634 Abnormal weight loss: Secondary | ICD-10-CM

## 2019-12-02 ENCOUNTER — Ambulatory Visit: Payer: Self-pay

## 2019-12-03 ENCOUNTER — Other Ambulatory Visit: Payer: Self-pay

## 2019-12-03 ENCOUNTER — Ambulatory Visit
Admission: RE | Admit: 2019-12-03 | Discharge: 2019-12-03 | Disposition: A | Discharge status: Home / Self Care | Payer: Self-pay | Source: Ambulatory Visit | Attending: Gastroenterology | Admitting: Gastroenterology

## 2019-12-03 DIAGNOSIS — R634 Abnormal weight loss: Secondary | ICD-10-CM | POA: Insufficient documentation

## 2019-12-03 LAB — POCT I-STAT CREATININE: Creatinine, Ser: 0.9 mg/dL (ref 0.44–1.00)

## 2019-12-03 MED ORDER — IOHEXOL 300 MG/ML  SOLN
75.0000 mL | Freq: Once | INTRAMUSCULAR | Status: AC | PRN
  Administered 2019-12-03: 75 mL via INTRAVENOUS

## 2019-12-10 ENCOUNTER — Other Ambulatory Visit: Payer: Self-pay | Admitting: Nurse Practitioner

## 2020-01-01 DIAGNOSIS — Z85038 Personal history of other malignant neoplasm of large intestine: Secondary | ICD-10-CM

## 2020-01-01 HISTORY — DX: Personal history of other malignant neoplasm of large intestine: Z85.038

## 2020-09-23 ENCOUNTER — Ambulatory Visit: Payer: Self-pay | Admitting: Nurse Practitioner

## 2020-09-30 ENCOUNTER — Encounter: Payer: Self-pay | Admitting: Nurse Practitioner

## 2020-09-30 ENCOUNTER — Other Ambulatory Visit: Payer: Self-pay

## 2020-09-30 ENCOUNTER — Ambulatory Visit (INDEPENDENT_AMBULATORY_CARE_PROVIDER_SITE_OTHER): Payer: 59 | Admitting: Nurse Practitioner

## 2020-09-30 VITALS — BP 107/76 | HR 73 | Temp 98.1°F

## 2020-09-30 DIAGNOSIS — Z136 Encounter for screening for cardiovascular disorders: Secondary | ICD-10-CM

## 2020-09-30 DIAGNOSIS — R5383 Other fatigue: Secondary | ICD-10-CM

## 2020-09-30 DIAGNOSIS — Z85038 Personal history of other malignant neoplasm of large intestine: Secondary | ICD-10-CM

## 2020-09-30 DIAGNOSIS — Z13220 Encounter for screening for lipoid disorders: Secondary | ICD-10-CM | POA: Diagnosis not present

## 2020-09-30 DIAGNOSIS — G4700 Insomnia, unspecified: Secondary | ICD-10-CM | POA: Diagnosis not present

## 2020-09-30 DIAGNOSIS — K219 Gastro-esophageal reflux disease without esophagitis: Secondary | ICD-10-CM | POA: Diagnosis not present

## 2020-09-30 MED ORDER — TRAZODONE HCL 150 MG PO TABS: 150.0000 mg | ORAL_TABLET | Freq: Every day | ORAL | 3 refills | Status: DC

## 2020-09-30 MED ORDER — OMEPRAZOLE 40 MG PO CPDR: 40.0000 mg | DELAYED_RELEASE_CAPSULE | Freq: Every day | ORAL | 3 refills | Status: AC

## 2020-09-30 NOTE — Assessment & Plan Note (Signed)
Chronic, stable. Continue current regimen. Refills sent to the pharmacy. Follow-up in 6 months. 

## 2020-09-30 NOTE — Assessment & Plan Note (Signed)
Treated over the last 9 months at [facility]. She had surgical resection with ileostomy and then later ileostomy take surgery. She didn't need chemotherapy or radiation. Oncologist recommends follow-up with CT chest/abdomen/pelvis annually for 5 years and 5 years of colon cancer follow-up. Her insurance recently changed, will place new referral for medical oncology.

## 2020-09-30 NOTE — Assessment & Plan Note (Signed)
Chronic, stable. Continue current regimen. Refills sent to the pharmacy. Follow-up with any concerns. 

## 2020-09-30 NOTE — Progress Notes (Signed)
Established Patient Office Visit  Subjective:  Patient ID: Crystal Barrett, female    DOB: [birth year]  Age: 65 y.o. MRN: [id number]  CC:  Chief Complaint  Patient presents with   Medication Refill   Colon Cancer    F/U    HPI Crystal Barrett presents for follow-up on GERD, insomnia, and colon cancer  She had a colonoscopy, with a perforated bowel and also found colon cancer. She was treated with surgery and states that she did not any chemotherapy or radiation. She had an ileostomy, but that was able to be reversed a few months ago. She has lost some weight and got down to about 100 pounds, but she is slowly able to put some weight back on. She endorses intermittent diarrhea when she eats. Denies abdominal pain and weakness. She changed health insurance recently and needs to get established with a medical oncologist in the [facility] system. She states that she does not need this follow-up until November.   Her insomnia is well controlled with trazodone, and her GERD is well controlled with prilosec. She does not have any side effects from these medications. She needs refills sent to her pharmacy.    Past Medical History:  Diagnosis Date   Acid reflux    Allergy    Chicken pox    Eczema    Osteopenia     Past Surgical History:  Procedure Laterality Date   COLONOSCOPY  2010   Diverticulitis. No polyps.    INNER EAR SURGERY     UPPER GASTROINTESTINAL ENDOSCOPY  2000   WISDOM TOOTH EXTRACTION      Family History  Problem Relation Age of Onset   Heart disease Mother    Ovarian cancer Maternal Aunt    Diabetes Daughter    Breast cancer Neg Hx    Colon cancer Neg Hx     Social History   Socioeconomic History   Marital status: Married    Spouse name: Not on file   Number of children: Not on file   Years of education: Not on file   Highest education level: Not on file  Occupational History   Not on file  Tobacco Use   Smoking status: Former    Packs/day:  0.50    Pack years: 0.00    Types: Cigarettes    Quit date: 07/05/2006    Years since quitting: 14.2   Smokeless tobacco: Never  Vaping Use   Vaping Use: Never used  Substance and Sexual Activity   Alcohol use: Yes    Alcohol/week: 0.0 standard drinks    Comment: Occassionally   Drug use: No   Sexual activity: Yes    Birth control/protection: Post-menopausal  Other Topics Concern   Not on file  Social History Narrative   Not on file   Social Determinants of Health   Financial Resource Strain: Not on file  Food Insecurity: Not on file  Transportation Needs: Not on file  Physical Activity: Not on file  Stress: Not on file  Social Connections: Not on file  Intimate Partner Violence: Not on file    Outpatient Medications Prior to Visit  Medication Sig Dispense Refill   Multiple Vitamins-Minerals (ALIVE ONCE DAILY WOMENS 50+ PO) Take 1 Dose by mouth daily.     NON FORMULARY Take 1 Scoop by mouth daily. Collagen Peptide Powder     omeprazole (PRILOSEC) 40 MG capsule Take 1 capsule (40 mg total) by mouth daily. 90 capsule 3  traZODone (DESYREL) 150 MG tablet Take 1 tablet (150 mg total) by mouth at bedtime. 90 tablet 3   clobetasol cream (TEMOVATE) 0.05 % Apply 1 application topically 2 (two) times daily. (Patient not taking: Reported on 09/30/2020)     DUPIXENT 300 MG/2ML SOPN Inject into the skin.     fluticasone (FLONASE) 50 MCG/ACT nasal spray  (Patient not taking: Reported on 09/30/2020)     No facility-administered medications prior to visit.    Allergies  Allergen Reactions   Codeine Nausea And Vomiting   Chlorhexidine Itching    Causes itching and burning   Other Rash    Vinyl Gloves-Flares Eczema    ROS Review of Systems  Constitutional:  Positive for fatigue.  HENT: Negative.    Eyes: Negative.   Respiratory: Negative.    Cardiovascular: Negative.   Gastrointestinal:  Positive for diarrhea (from colon surgery). Negative for abdominal pain and nausea.   Endocrine: Negative.   Genitourinary: Negative.   Musculoskeletal: Negative.   Skin: Negative.   Neurological: Negative.   Psychiatric/Behavioral: Negative.       Objective:    Physical Exam Vitals and nursing note reviewed.  Constitutional:      General: She is not in acute distress.    Appearance: Normal appearance.  HENT:     Head: Normocephalic and atraumatic.  Eyes:     Conjunctiva/sclera: Conjunctivae normal.  Neck:     Vascular: No carotid bruit.  Cardiovascular:     Rate and Rhythm: Normal rate and regular rhythm.     Pulses: Normal pulses.     Heart sounds: Normal heart sounds.  Pulmonary:     Effort: Pulmonary effort is normal.     Breath sounds: Normal breath sounds.  Abdominal:     Palpations: Abdomen is soft.     Tenderness: There is abdominal tenderness (slight tenderness to palpation in right and left lower quadrant).  Musculoskeletal:     Cervical back: Normal range of motion.     Right lower leg: No edema.     Left lower leg: No edema.  Skin:    General: Skin is warm and dry.  Neurological:     General: No focal deficit present.     Mental Status: She is alert and oriented to person, place, and time.  Psychiatric:        Mood and Affect: Mood normal.        Behavior: Behavior normal.        Thought Content: Thought content normal.        Judgment: Judgment normal.    BP 107/76   Pulse 73   Temp 98.1 F (36.7 C) (Oral)   SpO2 100%  Wt Readings from Last 3 Encounters:  09/03/19 114 lb 12.8 oz (52.1 kg)  03/03/19 125 lb 12.8 oz (57.1 kg)  06/10/18 133 lb (60.3 kg)     Health Maintenance Due  Topic Date Due   Zoster Vaccines- Shingrix (1 of 2) Never done   COVID-19 Vaccine (3 - Pfizer risk series) 12/13/2019    There are no preventive care reminders to display for this patient.  Lab Results  Component Value Date   TSH 1.80 03/03/2019   No results found for: WBC, HGB, HCT, MCV, PLT Lab Results  Component Value Date   NA 139  03/03/2019   K 3.9 03/03/2019   CO2 29 03/03/2019   GLUCOSE 94 03/03/2019   BUN 6 03/03/2019   CREATININE 0.90 12/03/2019   BILITOT 0.4  03/03/2019   ALKPHOS 98 03/03/2019   AST 12 03/03/2019   ALT 8 03/03/2019   PROT 6.6 03/03/2019   ALBUMIN 4.2 03/03/2019   CALCIUM 9.2 03/03/2019   GFR 65.73 03/03/2019   Lab Results  Component Value Date   CHOL 185 03/03/2019   Lab Results  Component Value Date   HDL 64.30 03/03/2019   Lab Results  Component Value Date   LDLCALC 101 (H) 03/03/2019   Lab Results  Component Value Date   TRIG 97.0 03/03/2019   Lab Results  Component Value Date   CHOLHDL 3 03/03/2019   Lab Results  Component Value Date   HGBA1C 5.6 10/12/2014      Assessment & Plan:   Problem List Items Addressed This Visit       Digestive   GERD (gastroesophageal reflux disease)    Chronic, stable. Continue current regimen. Refills sent to the pharmacy. Follow up with any concerns.        Relevant Medications   omeprazole (PRILOSEC) 40 MG capsule     Other   Insomnia    Chronic, stable. Continue current regimen. Refills sent to the pharmacy. Follow-up in 6 months.        Relevant Medications   traZODone (DESYREL) 150 MG tablet   History of colon cancer    Treated over the last 9 months at [facility]. She had surgical resection with ileostomy and then later ileostomy take surgery. She didn't need chemotherapy or radiation. Oncologist recommends follow-up with CT chest/abdomen/pelvis annually for 5 years and 5 years of colon cancer follow-up. Her insurance recently changed, will place new referral for medical oncology.        Relevant Orders   Ambulatory referral to Hematology / Oncology   Other Visit Diagnoses     Fatigue, unspecified type    -  Primary   Recent fatigue, most likely from recent colon cancer diagnosis and treatment. Will check CMP, CBC, and TSH   Relevant Orders   Comprehensive metabolic panel   CBC with Differential/Platelet   TSH    Vitamin B12   Encounter for lipid screening for cardiovascular disease       Check lipid panel today   Relevant Orders   Lipid Panel w/o Chol/HDL Ratio       Meds ordered this encounter  Medications   omeprazole (PRILOSEC) 40 MG capsule    Sig: Take 1 capsule (40 mg total) by mouth daily.    Dispense:  90 capsule    Refill:  3   traZODone (DESYREL) 150 MG tablet    Sig: Take 1 tablet (150 mg total) by mouth at bedtime.    Dispense:  90 tablet    Refill:  3    Follow-up: Return in about 6 months (around 04/02/2021) for physical.    Gerre Scull, NP

## 2020-10-01 LAB — CBC WITH DIFFERENTIAL/PLATELET
Basophils Absolute: 0.1 10*3/uL (ref 0.0–0.2)
Basos: 1 %
EOS (ABSOLUTE): 0.1 10*3/uL (ref 0.0–0.4)
Eos: 2 %
Hematocrit: 39.8 % (ref 34.0–46.6)
Hemoglobin: 13 g/dL (ref 11.1–15.9)
Immature Grans (Abs): 0 10*3/uL (ref 0.0–0.1)
Immature Granulocytes: 0 %
Lymphocytes Absolute: 1.4 10*3/uL (ref 0.7–3.1)
Lymphs: 28 %
MCH: 30.5 pg (ref 26.6–33.0)
MCHC: 32.7 g/dL (ref 31.5–35.7)
MCV: 93 fL (ref 79–97)
Monocytes Absolute: 0.3 10*3/uL (ref 0.1–0.9)
Monocytes: 6 %
Neutrophils Absolute: 3.2 10*3/uL (ref 1.4–7.0)
Neutrophils: 63 %
Platelets: 263 10*3/uL (ref 150–450)
RBC: 4.26 x10E6/uL (ref 3.77–5.28)
RDW: 13.2 % (ref 11.7–15.4)
WBC: 5 10*3/uL (ref 3.4–10.8)

## 2020-10-01 LAB — COMPREHENSIVE METABOLIC PANEL
ALT: 26 IU/L (ref 0–32)
AST: 30 IU/L (ref 0–40)
Albumin/Globulin Ratio: 2.2 (ref 1.2–2.2)
Albumin: 4.7 g/dL (ref 3.8–4.8)
Alkaline Phosphatase: 95 IU/L (ref 44–121)
BUN/Creatinine Ratio: 19 (ref 12–28)
BUN: 18 mg/dL (ref 8–27)
Bilirubin Total: 0.4 mg/dL (ref 0.0–1.2)
CO2: 24 mmol/L (ref 20–29)
Calcium: 9.9 mg/dL (ref 8.7–10.3)
Chloride: 101 mmol/L (ref 96–106)
Creatinine, Ser: 0.95 mg/dL (ref 0.57–1.00)
Globulin, Total: 2.1 g/dL (ref 1.5–4.5)
Glucose: 71 mg/dL (ref 65–99)
Potassium: 4.6 mmol/L (ref 3.5–5.2)
Sodium: 139 mmol/L (ref 134–144)
Total Protein: 6.8 g/dL (ref 6.0–8.5)
eGFR: 67 mL/min/{1.73_m2} (ref >59)

## 2020-10-01 LAB — TSH: TSH: 3.57 u[IU]/mL (ref 0.450–4.500)

## 2020-10-01 LAB — LIPID PANEL W/O CHOL/HDL RATIO
Cholesterol, Total: 192 mg/dL (ref 100–199)
HDL: 77 mg/dL (ref >39)
LDL Chol Calc (NIH): 105 mg/dL — ABNORMAL HIGH (ref 0–99)
Triglycerides: 53 mg/dL (ref 0–149)
VLDL Cholesterol Cal: 10 mg/dL (ref 5–40)

## 2020-10-01 LAB — VITAMIN B12: Vitamin B-12: 498 pg/mL (ref 232–1245)

## 2021-01-11 ENCOUNTER — Other Ambulatory Visit: Payer: Self-pay | Admitting: Infectious Diseases

## 2021-01-11 DIAGNOSIS — Z1231 Encounter for screening mammogram for malignant neoplasm of breast: Secondary | ICD-10-CM

## 2021-02-27 ENCOUNTER — Other Ambulatory Visit: Payer: Self-pay

## 2021-02-27 ENCOUNTER — Inpatient Hospital Stay: Payer: Medicare Other | Attending: Oncology | Admitting: Oncology

## 2021-02-27 ENCOUNTER — Encounter: Payer: Self-pay | Admitting: Oncology

## 2021-02-27 ENCOUNTER — Inpatient Hospital Stay: Payer: Medicare Other

## 2021-02-27 VITALS — BP 131/84 | HR 70 | Temp 96.3°F | Resp 17 | Wt 124.0 lb

## 2021-02-27 DIAGNOSIS — Z8041 Family history of malignant neoplasm of ovary: Secondary | ICD-10-CM | POA: Insufficient documentation

## 2021-02-27 DIAGNOSIS — Z85038 Personal history of other malignant neoplasm of large intestine: Secondary | ICD-10-CM | POA: Diagnosis not present

## 2021-02-27 NOTE — Progress Notes (Signed)
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 11/26/2019, patient had colonoscopy. 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  Adenocarcinoma directly involves cauterized margin.  Negative for lymphovascular invasion.  Patient has loss of MLH1 and PMS2  Small bowel biopsy, ascending colon, transverse colon were negative for malignancy.  Descending colon polyp resection showed tubular adenoma. 12/02/2019 patient has developed left lower quadrant abdominal pain. 12/03/2019, CT abdomen pelvis with contrast showed contained perforation of the colon extensive inflammatory changes are present throughout the pelvis and involve the left adnexa.  No free air or generalized omental stranding or peritoneal fluid.  Scattered subcentimeter lymph nodes throughout the pelvis and retroperitoneum which may be reactive in the current setting.  Largest approximately 7 mm along the left periaortic chain.  Patient was admitted at [facility] from 12/03/2019 - 12/22/2019 for bowel perforation secondary to colonoscopy versus diverticulitis versus malignancy.  Was given antibiotics and IV. Patient underwent diagnostic laparoscopy, diverting loop ileostomy and rectal washout on 12/11/2019.  Discharged on 12/22/2019 with IR drain in place. Admission 01/20/2020 - 01/26/2020, patient had exploratory laparotomy, ileostomy taken down, total abdominal colectomy with ileorectal anastomosis, splenic flexure mobilization, omental flap,  flexible sigmoidoscopy on 01/20/2020.  There was an abscess cavity which was contained and adjacent to uterus and left fallopian tube.  This was clearly the site of prior perforation. 01/20/2020, total colectomy showed no evidence of residual adenocarcinoma of the sigmoid colon.  Negative surgical margin.  No evidence of metastatic carcinoma in multiple pericolonic lymph nodes.  Diverticulum of the sigmoid colon.  Ulceration with acute/chronic inflammation, foreign body reaction and old hemorrhage of sigmoid colon wall with evidence of perforation and serosal fibrosis and chronic inflammation.  Unremarkable appendix.  Ileostomy site with unremarkable skin and ulceration, hemorrhage, inflammation of small bowel mucosa.  She recovered well.  Patient reports that she was told by her surgical oncologist that she no longer need colonoscopy for surveillance as patient has had complete  colectomy.  Patient wants to see local oncologist for surveillance.  Patient reports feeling well.  She has intermittent loose bowel movements.  Review of Systems  Constitutional:  Negative for appetite change, chills, fatigue and fever.  HENT:   Negative for hearing loss and voice change.   Eyes:  Negative for eye problems.  Respiratory:  Negative for chest tightness and cough.   Cardiovascular:  Negative for chest pain.  Gastrointestinal:  Negative for abdominal distention, abdominal pain and blood in stool.       Loose bowel movements  Endocrine: Negative for hot flashes.  Genitourinary:  Negative for difficulty urinating and frequency.   Musculoskeletal:  Negative for arthralgias.  Skin:  Negative for itching and rash.  Neurological:  Negative for extremity weakness.  Hematological:  Negative for adenopathy.  Psychiatric/Behavioral:  Negative for confusion.    MEDICAL HISTORY:  Past Medical History:  Diagnosis Date   Acid reflux    Allergy    Chicken pox    Eczema    Osteopenia  SURGICAL HISTORY: Past  Surgical History:  Procedure Laterality Date   COLONOSCOPY  2010   Diverticulitis. No polyps.    INNER EAR SURGERY     UPPER GASTROINTESTINAL ENDOSCOPY  2000   WISDOM TOOTH EXTRACTION      SOCIAL HISTORY: Social History   Socioeconomic History   Marital status: Married    Spouse name: Not on file   Number of children: Not on file   Years of education: Not on file   Highest education level: Not on file  Occupational History   Not on file  Tobacco Use   Smoking status: Former    Packs/day: 0.50    Types: Cigarettes    Quit date: 07/05/2006    Years since quitting: 14.6   Smokeless tobacco: Never  Vaping Use   Vaping Use: Never used  Substance and Sexual Activity   Alcohol use: Yes    Alcohol/week: 0.0 standard drinks    Comment: Occassionally   Drug use: No   Sexual activity: Yes    Birth control/protection: Post-menopausal  Other Topics Concern   Not on file  Social History Narrative   Not on file   Social Determinants of Health   Financial Resource Strain: Not on file  Food Insecurity: Not on file  Transportation Needs: Not on file  Physical Activity: Not on file  Stress: Not on file  Social Connections: Not on file  Intimate Partner Violence: Not on file    FAMILY HISTORY: Family History  Problem Relation Age of Onset   Heart disease Mother    Ovarian cancer Maternal Aunt    Diabetes Daughter    Breast cancer Neg Hx    Colon cancer Neg Hx     ALLERGIES:  is allergic to codeine, chlorhexidine, and other.  MEDICATIONS:  Current Outpatient Medications  Medication Sig Dispense Refill   cyanocobalamin 1000 MCG tablet Take by mouth.     Multiple Vitamins-Minerals (ALIVE ONCE DAILY WOMENS 50+ PO) Take 1 Dose by mouth daily.     NON FORMULARY Take 1 Scoop by mouth daily. Collagen Peptide Powder     omeprazole (PRILOSEC) 40 MG capsule Take 1 capsule (40 mg total) by mouth daily. 90 capsule 3   traZODone (DESYREL) 150 MG tablet Take 1 tablet (150 mg total)  by mouth at bedtime. 90 tablet 3   clobetasol cream (TEMOVATE) 6.78 % Apply 1 application topically 2 (two) times daily. (Patient not taking: Reported on 09/30/2020)     DUPIXENT 300 MG/2ML SOPN Inject into the skin.     fluticasone (FLONASE) 50 MCG/ACT nasal spray  (Patient not taking: Reported on 09/30/2020)     No current facility-administered medications for this visit.     PHYSICAL EXAMINATION: ECOG PERFORMANCE STATUS: 0 - Asymptomatic Vitals:   02/27/21 1024  BP: 131/84  Pulse: 70  Resp: 17  Temp: (!) 96.3 F (35.7 C)  SpO2: 99%   Filed Weights   02/27/21 1024  Weight: 124 lb (56.2 kg)    Physical Exam Constitutional:      General: She is not in acute distress. HENT:     Head: Normocephalic and atraumatic.  Eyes:     General: No scleral icterus. Cardiovascular:     Rate and Rhythm: Normal rate and regular rhythm.     Heart sounds: Normal heart sounds.  Pulmonary:     Effort: Pulmonary effort is normal. No respiratory distress.     Breath sounds: No wheezing.  Abdominal:     General:  Bowel sounds are normal. There is no distension.     Palpations: Abdomen is soft.  Musculoskeletal:        General: No deformity. Normal range of motion.     Cervical back: Normal range of motion and neck supple.  Skin:    General: Skin is warm and dry.     Findings: No erythema or rash.  Neurological:     Mental Status: She is alert and oriented to person, place, and time. Mental status is at baseline.     Cranial Nerves: No cranial nerve deficit.     Coordination: Coordination normal.  Psychiatric:        Mood and Affect: Mood normal.    LABORATORY DATA:  I have reviewed the data as listed Lab Results  Component Value Date   WBC 5.0 09/30/2020   HGB 13.0 09/30/2020   HCT 39.8 09/30/2020   MCV 93 09/30/2020   PLT 263 09/30/2020   Recent Labs    09/30/20 0929  NA 139  K 4.6  CL 101  CO2 24  GLUCOSE 71  BUN 18  CREATININE 0.95  CALCIUM 9.9  PROT 6.8  ALBUMIN  4.7  AST 30  ALT 26  ALKPHOS 95  BILITOT 0.4   Iron/TIBC/Ferritin/ %Sat No results found for: IRON, TIBC, FERRITIN, IRONPCTSAT    RADIOGRAPHIC STUDIES: I have personally reviewed the radiological images as listed and agreed with the findings in the report. No results found.    ASSESSMENT & PLAN:  1. History of colon cancer   2. Family history of ovarian cancer    #Stage I colon Cancer initial transverse colon polyp was positive for Invasive carcinoma, 4 mm, present at the cauterized margin. Final total colectomy showed T0N0 We discussed about NCCN guideline of surveillance of stage I colon cancer. Image surveillance Is not necessary unless clinically indicated. Recommend patient continue follow-up with gastroenterology.  I offered patient to follow-up with [place] once a year for history and physical examination, patient prefers to follow-up with primary care provider.  #Family history of ovarian cancer and personal history of colon cancer Discussed with patient about genetic testing.Patient would like to defer at this point and she will contact us if she is interested.  No follow-up appointment is scheduled at this point.  She is welcome t reestablish care with me if she develops new symptoms or concerns. of cancer recurrene.  All questions were answered. The patient knows to call the clinic with any problems questions or concerns.  cc McElwee, Lauren A, NP   Thank you for this kind referral and the opportunity to participate in the care of this patient. A copy of today's note is routed to referring provider   Earlie Server, MD, PhD  02/27/2021

## 2021-02-27 NOTE — Progress Notes (Signed)
.  Patient here for initial oncology appointment, concerns of bowl inconsistencies

## 2021-03-06 ENCOUNTER — Other Ambulatory Visit: Payer: Self-pay

## 2021-03-06 ENCOUNTER — Ambulatory Visit: Payer: Self-pay | Admitting: Oncology

## 2021-05-17 ENCOUNTER — Other Ambulatory Visit: Payer: Self-pay

## 2021-05-17 ENCOUNTER — Ambulatory Visit
Admission: RE | Admit: 2021-05-17 | Discharge: 2021-05-17 | Disposition: A | Discharge status: Home / Self Care | Payer: Medicare Other | Source: Ambulatory Visit | Attending: Infectious Diseases | Admitting: Infectious Diseases

## 2021-05-17 DIAGNOSIS — Z1231 Encounter for screening mammogram for malignant neoplasm of breast: Secondary | ICD-10-CM | POA: Diagnosis present

## 2023-02-08 ENCOUNTER — Other Ambulatory Visit: Payer: Self-pay | Admitting: Infectious Diseases

## 2023-02-08 DIAGNOSIS — Z1231 Encounter for screening mammogram for malignant neoplasm of breast: Secondary | ICD-10-CM

## 2023-02-11 ENCOUNTER — Ambulatory Visit
Admission: RE | Admit: 2023-02-11 | Discharge: 2023-02-11 | Disposition: A | Discharge status: Home / Self Care | Payer: Medicare Other | Source: Ambulatory Visit | Attending: Infectious Diseases | Admitting: Infectious Diseases

## 2023-02-11 DIAGNOSIS — Z1231 Encounter for screening mammogram for malignant neoplasm of breast: Secondary | ICD-10-CM | POA: Diagnosis present

## 2023-02-22 ENCOUNTER — Encounter: Payer: Self-pay | Admitting: *Deleted

## 2023-06-07 ENCOUNTER — Other Ambulatory Visit: Payer: Self-pay | Admitting: Emergency Medicine

## 2023-06-07 DIAGNOSIS — F17210 Nicotine dependence, cigarettes, uncomplicated: Secondary | ICD-10-CM

## 2023-06-07 DIAGNOSIS — Z122 Encounter for screening for malignant neoplasm of respiratory organs: Secondary | ICD-10-CM

## 2023-06-20 ENCOUNTER — Ambulatory Visit
Admission: RE | Admit: 2023-06-20 | Discharge: 2023-06-20 | Disposition: A | Discharge status: Home / Self Care | Source: Ambulatory Visit | Attending: Emergency Medicine | Admitting: Emergency Medicine

## 2023-06-20 DIAGNOSIS — F17210 Nicotine dependence, cigarettes, uncomplicated: Secondary | ICD-10-CM | POA: Diagnosis present

## 2023-06-20 DIAGNOSIS — Z122 Encounter for screening for malignant neoplasm of respiratory organs: Secondary | ICD-10-CM | POA: Insufficient documentation

## 2023-07-12 ENCOUNTER — Other Ambulatory Visit: Payer: Self-pay | Admitting: Pulmonary Disease

## 2023-07-15 ENCOUNTER — Inpatient Hospital Stay
Admission: RE | Admit: 2023-07-15 | Discharge: 2023-07-15 | Disposition: A | Discharge status: Home / Self Care | Source: Ambulatory Visit

## 2023-07-15 HISTORY — DX: Personal history of other malignant neoplasm of large intestine: Z85.038

## 2023-07-15 HISTORY — DX: Solitary pulmonary nodule: R91.1

## 2023-07-15 HISTORY — DX: Unspecified asthma, uncomplicated: J45.909

## 2023-07-15 HISTORY — DX: Tobacco use: Z72.0

## 2023-07-15 HISTORY — DX: Helicobacter pylori (H. pylori) as the cause of diseases classified elsewhere: B96.81

## 2023-07-15 NOTE — Patient Instructions (Signed)
 Your procedure is scheduled on:07-19-23 Friday Report to the Registration Desk on the 1st floor of the [facility].Then proceed to the 2nd floor Surgery Desk To find out your arrival time, please call [phone] between 1PM - 3PM on:07-18-23 Thursday If your arrival time is 6:00 am, do not arrive before that time as the [facility] entrance doors do not open until 6:00 am.  REMEMBER: Instructions that are not followed completely may result in serious medical risk, up to and including death; or upon the discretion of your surgeon and anesthesiologist your surgery may need to be rescheduled.  Do not eat food after midnight the night before surgery.  No gum chewing or hard candies.  You may however, drink CLEAR liquids up to 2 hours before you are scheduled to arrive for your surgery. Do not drink anything within 2 hours of your scheduled arrival time.  Clear liquids include: - water  - apple juice without pulp - gatorade (not RED colors) - black coffee or tea (Do NOT add milk or creamers to the coffee or tea) Do NOT drink anything that is not on this list.  One week prior to surgery:Stop NOW (07-15-23) Stop Anti-inflammatories (NSAIDS) such as Advil, Aleve, Ibuprofen, Motrin, Naproxen, Naprosyn and Aspirin based products such as Excedrin, Goody's Powder, BC Powder. Stop ANY OVER THE COUNTER supplements until after surgery (Multivitamin)  You may however, continue to take Tylenol if needed for pain up until the day of surgery.  Continue taking all of your other prescription medications up until the day of surgery.  ON THE DAY OF SURGERY ONLY TAKE THESE MEDICATIONS WITH SIPS OF WATER: -omeprazole (PRILOSEC)   No Alcohol for 24 hours before or after surgery.  No Smoking including e-cigarettes for 24 hours before surgery.  No chewable tobacco products for at least 6 hours before surgery.  No nicotine patches on the day of surgery.  Do not use any "recreational" drugs for at least a  week (preferably 2 weeks) before your surgery.  Please be advised that the combination of cocaine and anesthesia may have negative outcomes, up to and including death. If you test positive for cocaine, your surgery will be cancelled.  On the morning of surgery brush your teeth with toothpaste and water, you may rinse your mouth with mouthwash if you wish. Do not swallow any toothpaste or mouthwash.  Do not wear jewelry, make-up, hairpins, clips or nail polish.  For welded (permanent) jewelry: bracelets, anklets, waist bands, etc.  Please have this removed prior to surgery.  If it is not removed, there is a chance that hospital personnel will need to cut it off on the day of surgery.  Do not wear lotions, powders, or perfumes.   Do not shave body hair from the neck down 48 hours before surgery.  Contact lenses, hearing aids and dentures may not be worn into surgery.  Do not bring valuables to the hospital. [facility] is not responsible for any missing/lost belongings or valuables.    Notify your doctor if there is any change in your medical condition (cold, fever, infection).  Wear comfortable clothing (specific to your surgery type) to the hospital.  After surgery, you can help prevent lung complications by doing breathing exercises.  Take deep breaths and cough every 1-2 hours. Your doctor may order a device called an Incentive Spirometer to help you take deep breaths. When coughing or sneezing, hold a pillow firmly against your incision with both hands. This is called "splinting."  Doing this helps protect your incision. It also decreases belly discomfort.  If you are being admitted to the hospital overnight, leave your suitcase in the car. After surgery it may be brought to your room.  In case of increased patient census, it may be necessary for you, the patient, to continue your postoperative care in the Same Day Surgery department.  If you are being discharged the day of surgery,  you will not be allowed to drive home. You will need a responsible individual to drive you home and stay with you for 24 hours after surgery.   If you are taking public transportation, you will need to have a responsible individual with you.  Please call the Pre-admissions Testing Dept. at [phone] if you have any questions about these instructions.  Surgery Visitation Policy:  Patients having surgery or a procedure may have two visitors.  Children under the age of 64 must have an adult with them who is not the patient.

## 2023-07-17 ENCOUNTER — Other Ambulatory Visit: Payer: Self-pay | Admitting: Pulmonary Disease

## 2023-07-17 ENCOUNTER — Encounter
Admission: RE | Admit: 2023-07-17 | Discharge: 2023-07-17 | Disposition: A | Discharge status: Home / Self Care | Source: Ambulatory Visit | Attending: Pulmonary Disease | Admitting: Pulmonary Disease

## 2023-07-17 ENCOUNTER — Other Ambulatory Visit: Payer: Self-pay

## 2023-07-17 DIAGNOSIS — Z01810 Encounter for preprocedural cardiovascular examination: Secondary | ICD-10-CM

## 2023-07-17 DIAGNOSIS — Z01812 Encounter for preprocedural laboratory examination: Secondary | ICD-10-CM | POA: Diagnosis not present

## 2023-07-17 DIAGNOSIS — J432 Centrilobular emphysema: Secondary | ICD-10-CM | POA: Diagnosis not present

## 2023-07-17 DIAGNOSIS — Z01818 Encounter for other preprocedural examination: Secondary | ICD-10-CM | POA: Diagnosis present

## 2023-07-17 DIAGNOSIS — R911 Solitary pulmonary nodule: Secondary | ICD-10-CM

## 2023-07-17 HISTORY — DX: Other specified postprocedural states: Z98.890

## 2023-07-17 HISTORY — DX: Tobacco use: Z72.0

## 2023-07-17 HISTORY — DX: Personal history of nicotine dependence: Z87.891

## 2023-07-17 HISTORY — DX: Nausea with vomiting, unspecified: R11.2

## 2023-07-17 HISTORY — DX: Centrilobular emphysema: J43.2

## 2023-07-17 LAB — CBC
HCT: 40.3 % (ref 36.0–46.0)
Hemoglobin: 13.5 g/dL (ref 12.0–15.0)
MCH: 32.3 pg (ref 26.0–34.0)
MCHC: 33.5 g/dL (ref 30.0–36.0)
MCV: 96.4 fL (ref 80.0–100.0)
Platelets: 255 10*3/uL (ref 150–400)
RBC: 4.18 MIL/uL (ref 3.87–5.11)
RDW: 12.3 % (ref 11.5–15.5)
WBC: 6.2 10*3/uL (ref 4.0–10.5)
nRBC: 0 % (ref 0.0–0.2)

## 2023-07-17 LAB — BASIC METABOLIC PANEL WITH GFR
Anion gap: 6 (ref 5–15)
BUN: 13 mg/dL (ref 8–23)
CO2: 25 mmol/L (ref 22–32)
Calcium: 9.2 mg/dL (ref 8.9–10.3)
Chloride: 104 mmol/L (ref 98–111)
Creatinine, Ser: 0.95 mg/dL (ref 0.44–1.00)
GFR, Estimated: >60 mL/min (ref >60)
Glucose, Bld: 129 mg/dL — ABNORMAL HIGH (ref 70–99)
Potassium: 4.1 mmol/L (ref 3.5–5.1)
Sodium: 135 mmol/L (ref 135–145)

## 2023-07-17 NOTE — Patient Instructions (Signed)
 Your procedure is scheduled on:07-19-23 Friday Report to the Registration Desk on the 1st floor of the [facility].Then proceed to the 2nd floor Surgery Desk To find out your arrival time, please call [phone] between 1PM - 3PM on:07-18-23 Thursday If your arrival time is 6:00 am, do not arrive before that time as the [facility] entrance doors do not open until 6:00 am.  REMEMBER: Instructions that are not followed completely may result in serious medical risk, up to and including death; or upon the discretion of your surgeon and anesthesiologist your surgery may need to be rescheduled.  Do not eat food after midnight the night before surgery.  No gum chewing or hard candies.  You may however, drink CLEAR liquids up to 2 hours before you are scheduled to arrive for your surgery. Do not drink anything within 2 hours of your scheduled arrival time.  Clear liquids include: - water  - apple juice without pulp - gatorade (not RED colors) - black coffee or tea (Do NOT add milk or creamers to the coffee or tea) Do NOT drink anything that is not on this list.  One week prior to surgery:Stop NOW (07-17-23) Stop Anti-inflammatories (NSAIDS) such as Advil, Aleve, Ibuprofen, Motrin, Naproxen, Naprosyn and Aspirin based products such as Excedrin, Goody's Powder, BC Powder. Stop ANY OVER THE COUNTER supplements until after surgery (Multivitamin)  You may however, continue to take Tylenol if needed for pain up until the day of surgery.  Continue taking all of your other prescription medications up until the day of surgery.  ON THE DAY OF SURGERY ONLY TAKE THESE MEDICATIONS WITH SIPS OF WATER: -omeprazole (PRILOSEC)   No Alcohol for 24 hours before or after surgery.  No Smoking including e-cigarettes for 24 hours before surgery.  No chewable tobacco products for at least 6 hours before surgery.  No nicotine patches on the day of surgery.  Do not use any "recreational" drugs for at least a  week (preferably 2 weeks) before your surgery.  Please be advised that the combination of cocaine and anesthesia may have negative outcomes, up to and including death. If you test positive for cocaine, your surgery will be cancelled.  On the morning of surgery brush your teeth with toothpaste and water, you may rinse your mouth with mouthwash if you wish. Do not swallow any toothpaste or mouthwash.  Do not wear jewelry, make-up, hairpins, clips or nail polish.  For welded (permanent) jewelry: bracelets, anklets, waist bands, etc.  Please have this removed prior to surgery.  If it is not removed, there is a chance that hospital personnel will need to cut it off on the day of surgery.  Do not wear lotions, powders, or perfumes.   Do not shave body hair from the neck down 48 hours before surgery.  Contact lenses, hearing aids and dentures may not be worn into surgery.  Do not bring valuables to the hospital. [facility] is not responsible for any missing/lost belongings or valuables.   Notify your doctor if there is any change in your medical condition (cold, fever, infection).  Wear comfortable clothing (specific to your surgery type) to the hospital.  After surgery, you can help prevent lung complications by doing breathing exercises.  Take deep breaths and cough every 1-2 hours. Your doctor may order a device called an Incentive Spirometer to help you take deep breaths. When coughing or sneezing, hold a pillow firmly against your incision with both hands. This is called "splinting." Doing  this helps protect your incision. It also decreases belly discomfort.  If you are being admitted to the hospital overnight, leave your suitcase in the car. After surgery it may be brought to your room.  In case of increased patient census, it may be necessary for you, the patient, to continue your postoperative care in the Same Day Surgery department.  If you are being discharged the day of surgery,  you will not be allowed to drive home. You will need a responsible individual to drive you home and stay with you for 24 hours after surgery.   If you are taking public transportation, you will need to have a responsible individual with you.  Please call the Pre-admissions Testing Dept. at [phone] if you have any questions about these instructions.  Surgery Visitation Policy:  Patients having surgery or a procedure may have two visitors.  Children under the age of 75 must have an adult with them who is not the patient.

## 2023-07-18 ENCOUNTER — Ambulatory Visit
Admission: RE | Admit: 2023-07-18 | Discharge: 2023-07-18 | Disposition: A | Discharge status: Home / Self Care | Source: Ambulatory Visit | Attending: Pulmonary Disease | Admitting: Pulmonary Disease

## 2023-07-18 DIAGNOSIS — R911 Solitary pulmonary nodule: Secondary | ICD-10-CM | POA: Diagnosis present

## 2023-07-19 ENCOUNTER — Other Ambulatory Visit: Payer: Self-pay

## 2023-07-19 ENCOUNTER — Ambulatory Visit

## 2023-07-19 ENCOUNTER — Ambulatory Visit
Admission: RE | Admit: 2023-07-19 | Discharge: 2023-07-19 | Disposition: A | Discharge status: Home / Self Care | Attending: Pulmonary Disease | Admitting: Pulmonary Disease

## 2023-07-19 ENCOUNTER — Ambulatory Visit: Payer: Self-pay

## 2023-07-19 ENCOUNTER — Encounter
Admission: RE | Disposition: A | Discharge status: Home / Self Care | Payer: Self-pay | Source: Home / Self Care | Attending: Pulmonary Disease

## 2023-07-19 DIAGNOSIS — R911 Solitary pulmonary nodule: Secondary | ICD-10-CM | POA: Insufficient documentation

## 2023-07-19 DIAGNOSIS — Z79899 Other long term (current) drug therapy: Secondary | ICD-10-CM | POA: Diagnosis not present

## 2023-07-19 DIAGNOSIS — Z85038 Personal history of other malignant neoplasm of large intestine: Secondary | ICD-10-CM | POA: Insufficient documentation

## 2023-07-19 DIAGNOSIS — J439 Emphysema, unspecified: Secondary | ICD-10-CM | POA: Diagnosis not present

## 2023-07-19 DIAGNOSIS — Z7951 Long term (current) use of inhaled steroids: Secondary | ICD-10-CM | POA: Insufficient documentation

## 2023-07-19 DIAGNOSIS — M8580 Other specified disorders of bone density and structure, unspecified site: Secondary | ICD-10-CM | POA: Diagnosis not present

## 2023-07-19 DIAGNOSIS — K219 Gastro-esophageal reflux disease without esophagitis: Secondary | ICD-10-CM | POA: Diagnosis not present

## 2023-07-19 DIAGNOSIS — Z87891 Personal history of nicotine dependence: Secondary | ICD-10-CM | POA: Insufficient documentation

## 2023-07-19 DIAGNOSIS — C3432 Malignant neoplasm of lower lobe, left bronchus or lung: Secondary | ICD-10-CM | POA: Insufficient documentation

## 2023-07-19 HISTORY — PX: VIDEO BRONCHOSCOPY WITH ENDOBRONCHIAL NAVIGATION: SHX6175

## 2023-07-19 HISTORY — PX: VIDEO BRONCHOSCOPY WITH ENDOBRONCHIAL ULTRASOUND: SHX6177

## 2023-07-19 SURGERY — BRONCHOSCOPY, WITH EBUS
Anesthesia: General

## 2023-07-19 MED ORDER — DEXAMETHASONE SODIUM PHOSPHATE 10 MG/ML IJ SOLN
INTRAMUSCULAR | Status: DC | PRN
  Administered 2023-07-19: 10 mg via INTRAVENOUS

## 2023-07-19 MED ORDER — ONDANSETRON HCL 4 MG/2ML IJ SOLN: 4.0000 mg | Freq: Once | INTRAMUSCULAR | Status: DC | PRN

## 2023-07-19 MED ORDER — LACTATED RINGERS IV SOLN: INTRAVENOUS | Status: DC

## 2023-07-19 MED ORDER — OXYCODONE HCL 5 MG PO TABS: 5.0000 mg | ORAL_TABLET | Freq: Once | ORAL | Status: DC | PRN

## 2023-07-19 MED ORDER — FENTANYL CITRATE (PF) 100 MCG/2ML IJ SOLN: 25.0000 ug | INTRAMUSCULAR | Status: DC | PRN

## 2023-07-19 MED ORDER — MIDAZOLAM HCL 2 MG/2ML IJ SOLN
INTRAMUSCULAR | Status: AC
  Filled 2023-07-19: qty 2

## 2023-07-19 MED ORDER — PROPOFOL 10 MG/ML IV BOLUS
INTRAVENOUS | Status: DC | PRN
  Administered 2023-07-19: 100 mg via INTRAVENOUS

## 2023-07-19 MED ORDER — MIDAZOLAM HCL 2 MG/2ML IJ SOLN
INTRAMUSCULAR | Status: DC | PRN
  Administered 2023-07-19: 2 mg via INTRAVENOUS

## 2023-07-19 MED ORDER — FENTANYL CITRATE (PF) 100 MCG/2ML IJ SOLN
INTRAMUSCULAR | Status: DC | PRN
  Administered 2023-07-19 (×2): 50 ug via INTRAVENOUS

## 2023-07-19 MED ORDER — LACTATED RINGERS IV SOLN: INTRAVENOUS | Status: DC | PRN

## 2023-07-19 MED ORDER — ONDANSETRON HCL 4 MG/2ML IJ SOLN
INTRAMUSCULAR | Status: AC
  Filled 2023-07-19: qty 2

## 2023-07-19 MED ORDER — OXYCODONE HCL 5 MG/5ML PO SOLN: 5.0000 mg | Freq: Once | ORAL | Status: DC | PRN

## 2023-07-19 MED ORDER — DEXAMETHASONE SODIUM PHOSPHATE 10 MG/ML IJ SOLN
INTRAMUSCULAR | Status: AC
  Filled 2023-07-19: qty 1

## 2023-07-19 MED ORDER — ORAL CARE MOUTH RINSE: 15.0000 mL | Freq: Once | OROMUCOSAL | Status: DC

## 2023-07-19 MED ORDER — FENTANYL CITRATE (PF) 100 MCG/2ML IJ SOLN
INTRAMUSCULAR | Status: AC
Start: 2023-07-19 — End: ?
  Filled 2023-07-19: qty 2

## 2023-07-19 MED ORDER — SUGAMMADEX SODIUM 200 MG/2ML IV SOLN
INTRAVENOUS | Status: DC | PRN
  Administered 2023-07-19: 200 mg via INTRAVENOUS

## 2023-07-19 MED ORDER — EPHEDRINE SULFATE-NACL 50-0.9 MG/10ML-% IV SOSY
PREFILLED_SYRINGE | INTRAVENOUS | Status: DC | PRN
  Administered 2023-07-19 (×2): 10 mg via INTRAVENOUS

## 2023-07-19 MED ORDER — ROCURONIUM BROMIDE 100 MG/10ML IV SOLN
INTRAVENOUS | Status: DC | PRN
  Administered 2023-07-19: 50 mg via INTRAVENOUS

## 2023-07-19 MED ORDER — PROPOFOL 10 MG/ML IV BOLUS
INTRAVENOUS | Status: AC
  Filled 2023-07-19: qty 20

## 2023-07-19 MED ORDER — LIDOCAINE HCL (CARDIAC) PF 100 MG/5ML IV SOSY
PREFILLED_SYRINGE | INTRAVENOUS | Status: DC | PRN
  Administered 2023-07-19: 60 mg via INTRAVENOUS

## 2023-07-19 MED ORDER — SEVOFLURANE IN SOLN
RESPIRATORY_TRACT | Status: AC
  Filled 2023-07-19: qty 250

## 2023-07-19 MED ORDER — ONDANSETRON HCL 4 MG/2ML IJ SOLN
INTRAMUSCULAR | Status: DC | PRN
  Administered 2023-07-19: 4 mg via INTRAVENOUS

## 2023-07-19 MED ORDER — PHENYLEPHRINE 80 MCG/ML (10ML) SYRINGE FOR IV PUSH (FOR BLOOD PRESSURE SUPPORT)
PREFILLED_SYRINGE | INTRAVENOUS | Status: DC | PRN
  Administered 2023-07-19: 80 ug via INTRAVENOUS

## 2023-07-19 NOTE — H&P (Signed)
 PULMONOLOGY         Date: 07/19/2023,   MRN# [id number] Crystal Barrett 1955/07/18     AdmissionWeight: 61.2 kg                 CurrentWeight: 61.2 kg  Referring provider: Anastacio Karvonen, NP   CHIEF COMPLAINT:   Left lower lobe 2.3cm semisolid nodule    HISTORY OF PRESENT ILLNESS   This is a 68 yo F with a history of colon cancer, life long smoking and actively smoking.  She also has history of GERD, Allergies, osteopenia, and emphysema/COPD.  She is enrolled in lung cancer screening program.  There is a study done recently which we reviewed in clinic. The area of concern is a 23mm well circumscribed lesion of LLL which is part solid and is suspicious for possible cancer. Today we will perform bronchoscopy with lung biopsy.  We plan to perform airway inspection, BAL for infectious etiology and cancer, therpeutically aspirate any mucus plugging noted during exam and utilize robotic assistance for lung biopsy.  We will visualize lymph nodes around heart and lungs for any abnormal lymphadenopathy with endobronchial ultrasound.        Reviewed risks/complications and benefits with patient, risks include infection, pneumothorax/pneumomediastinum which may require chest tube placement as well as overnight/prolonged hospitalization and possible mechanical ventilation. Other risks include bleeding and very rarely death.  Patient understands risks and wishes to proceed.  Additional questions were answered, and patient is aware that post procedure patient will be going home with family and may experience cough with possible clots on expectoration as well as phlegm which may last few days as well as hoarseness of voice post intubation and mechanical ventilation.    PAST MEDICAL HISTORY   Past Medical History:  Diagnosis Date   Acid reflux    Allergy    Asthma    Centrilobular emphysema (HCC)    Chicken pox    Chronic Helicobacter pylori gastritis    Eczema    Former cigarette  smoker    History of colon cancer 01/2020   Lung nodule    Osteopenia    PONV (postoperative nausea and vomiting)    Vapes nicotine containing substance      SURGICAL HISTORY   Past Surgical History:  Procedure Laterality Date   COLECTOMY     with ileostomy   COLONOSCOPY  2010   Diverticulitis. No polyps.    ILEOSTOMY CLOSURE     INNER EAR SURGERY     UPPER GASTROINTESTINAL ENDOSCOPY  2000   WISDOM TOOTH EXTRACTION       FAMILY HISTORY   Family History  Problem Relation Age of Onset   Heart disease Mother    Ovarian cancer Maternal Aunt    Diabetes Daughter    Breast cancer Neg Hx    Colon cancer Neg Hx      SOCIAL HISTORY   Social History   Tobacco Use   Smoking status: Former    Current packs/day: 0.00    Types: Cigarettes    Quit date: 06/04/2006    Years since quitting: 17.1   Smokeless tobacco: Never  Vaping Use   Vaping status: Every Day   Substances: Nicotine, Flavoring  Substance Use Topics   Alcohol use: Yes    Alcohol/week: 0.0 standard drinks of alcohol    Comment: Occassionally   Drug use: No     MEDICATIONS    Home Medication:    Current Medication:  Current  Facility-Administered Medications:    lactated ringers  infusion, , Intravenous, Continuous, Mazzoni, Andrea, MD, Last Rate: 10 mL/hr at 07/19/23 0701, New Bag at 07/19/23 0701   Oral care mouth rinse, 15 mL, Mouth Rinse, Once, Dorothey Gate, MD    ALLERGIES   Codeine, Chlorhexidine, and Other     REVIEW OF SYSTEMS    Review of Systems:  Gen:  Denies  fever, sweats, chills weigh loss  HEENT: Denies blurred vision, double vision, ear pain, eye pain, hearing loss, nose bleeds, sore throat Cardiac:  No dizziness, chest pain or heaviness, chest tightness,edema Resp:   reports dyspnea chronically  Gi: Denies swallowing difficulty, stomach pain, nausea or vomiting, diarrhea, constipation, bowel incontinence Gu:  Denies bladder incontinence, burning urine Ext:   Denies  Joint pain, stiffness or swelling Skin: Denies  skin rash, easy bruising or bleeding or hives Endoc:  Denies polyuria, polydipsia , polyphagia or weight change Psych:   Denies depression, insomnia or hallucinations   Other:  All other systems negative   VS: BP 119/72   Pulse 70   Temp 98.4 F (36.9 C) (Temporal)   Resp 16   Ht 5\' 5"  (1.651 m)   Wt 61.2 kg   SpO2 99%   BMI 22.45 kg/m      PHYSICAL EXAM    GENERAL:NAD, no fevers, chills, no weakness no fatigue HEAD: Normocephalic, atraumatic.  EYES: Pupils equal, round, reactive to light. Extraocular muscles intact. No scleral icterus.  MOUTH: Moist mucosal membrane. Dentition intact. No abscess noted.  EAR, NOSE, THROAT: Clear without exudates. No external lesions.  NECK: Supple. No thyromegaly. No nodules. No JVD.  PULMONARY: decreased breath sounds with mild rhonchi worse at bases bilaterally.  CARDIOVASCULAR: S1 and S2. Regular rate and rhythm. No murmurs, rubs, or gallops. No edema. Pedal pulses 2+ bilaterally.  GASTROINTESTINAL: Soft, nontender, nondistended. No masses. Positive bowel sounds. No hepatosplenomegaly.  MUSCULOSKELETAL: No swelling, clubbing, or edema. Range of motion full in all extremities.  NEUROLOGIC: Cranial nerves II through XII are intact. No gross focal neurological deficits. Sensation intact. Reflexes intact.  SKIN: No ulceration, lesions, rashes, or cyanosis. Skin warm and dry. Turgor intact.  PSYCHIATRIC: Mood, affect within normal limits. The patient is awake, alert and oriented x 3. Insight, judgment intact.       IMAGING  CT chest - reviewed by me 06/20/23- LLL 23mm nodule suspicious for possible neoplasm  ASSESSMENT/PLAN   23mm LLL nodule     Spiculated and part solid - unable to rule out neoplasm    - plan for bronchoscopy with airway inspection and BAL for micro and cytology,  therpeutic aspiration of any noted mucus plugging, endobronchial ultrasound and RANB for lung biopsy    -Reviewed risks/complications and benefits with patient, risks include infection, pneumothorax/pneumomediastinum which may require chest tube placement as well as overnight/prolonged hospitalization and possible mechanical ventilation. Other risks include bleeding and very rarely death.  Patient understands risks and wishes to proceed.  Additional questions were answered, and patient is aware that post procedure patient will be going home with family and may experience cough with possible clots on expectoration as well as phlegm which may last few days as well as hoarseness of voice post intubation and mechanical ventilation.             Thank you for allowing me to participate in the care of this patient.   Patient/Family are satisfied with care plan and all questions have been answered.    Provider disclosure: Patient  with at least one acute or chronic illness or injury that poses a threat to life or bodily function and is being managed actively during this encounter.  All of the below services have been performed independently by signing provider:  review of prior documentation from internal and or external health records.  Review of previous and current lab results.  Interview and comprehensive assessment during patient visit today. Review of current and previous chest radiographs/CT scans. Discussion of management and test interpretation with health care team and patient/family.   This document was prepared using Dragon voice recognition software and may include unintentional dictation errors.     Czarina Gingras, M.D.  Division of Pulmonary & Critical Care Medicine

## 2023-07-19 NOTE — Transfer of Care (Signed)
 Immediate Anesthesia Transfer of Care Note  Patient: Crystal Barrett  Procedure(s) Performed: BRONCHOSCOPY, WITH EBUS VIDEO BRONCHOSCOPY WITH ENDOBRONCHIAL NAVIGATION  Patient Location: PACU  Anesthesia Type:General  Level of Consciousness: drowsy and patient cooperative  Airway & Oxygen Therapy: Patient Spontanous Breathing  Post-op Assessment: Report given to RN and Post -op Vital signs reviewed and stable  Post vital signs: Reviewed  Last Vitals:  Vitals Value Taken Time  BP 110/59 07/19/23 0948  Temp 36.2 C 07/19/23 0940  Pulse 86 07/19/23 0950  Resp 16 07/19/23 0948  SpO2 96 % 07/19/23 0950  Vitals shown include unfiled device data.  Last Pain:  Vitals:   07/19/23 0940  TempSrc:   PainSc: 0-No pain         Complications: No notable events documented.

## 2023-07-19 NOTE — Procedures (Signed)
 ROBOTIC NAVIGATIONAL BRONCHOSCOPY PROCEDURE NOTE 31267  FIBEROPTIC BRONCHOSCOPY WITH BRONCHOALVEOLAR LAVAGE 21308 PROCEDURE NOTE  THERAPEUTIC ASPIRATION OF TRACHEOBRONCHIAL TREE 31645 PROCEDURE NOTE  ENDOBRONCHIAL ULTRASOUND NO BIOPSIES 31620 PROCEDURE NOTE  BRONCHOSCOPY WITH PLACEMENT OF FIDUCIAL MARKERS 31626 PROCEDURE NOTE  Flexible bronchoscopy was performed  by : Jaclynn Mast MD  assistance by : 1)Repiratory therapist  and 2) [employer] staff and 3) Anesthesia team and 4) Flouroscopy team and 5) [employer] staff   Indication for the procedure was :  Pre-procedural H&P. The following assessment was performed on the day of the procedure prior to initiating sedation History:  Chest pain n Dyspnea y Hemoptysis n Cough y Fever n Other pertinent items n  Examination Vital signs -reviewed as per nursing documentation today Cardiac    Murmurs: n  Rubs : n  Gallop: n Lungs Wheezing: n Rales : n Rhonchi :y  Other pertinent findings: SOB/hypoxemia due to chronic lung disease   Pre-procedural assessment for Procedural Sedation included: Depth of sedation: As per anesthesia team  ASA Classification:  2 Mallampati airway assessment: 3    Medication list reviewed: y  The patient's interval history was taken and revealed: no new complaints The pre- procedure physical examination revealed: No new findings Refer to prior clinic note for details.  Informed Consent: Informed consent was obtained from:  patient after explanation of procedure and risks, benefits, as well as alternative procedures available.  Explanation of level of sedation and possible transfusion was also provided.    Procedural Preparation: Time out was performed and patient was identified by name and birthdate and procedure to be performed and side for sampling, if any, was specified. Pt was intubated by anesthesia.  The patient was appropriately draped.   Fiberoptic bronchoscopy with airway inspection,  Therapeutic aspiration of tracheobronchial tree and BAL Procedure findings:  Bronchoscope was inserted via ETT  without difficulty.  Posterior oropharynx, epiglottis, arytenoids, false cords and vocal cords were not visualized as these were bypassed by endotracheal tube. The distal trachea was normal in circumference and appearance without mucosal, cartilaginous or branching abnormalities.  The main carina was mildly splayed . Not all airways were initially visualized due to mucus plugging. This was treated with therapeutic aspiration prior to beginning robotic bronchoscopy.  There were no exophytic lesions found bilaterally.  Sub- sub segmental carinae were identified in all the distal airways.    The mucosa was :mildly friable bilaterally  Airways were notable for:        exophytic lesions :n       extrinsic compression in the following distributions: n.       Friable mucosa: y       Teacher, music /pigmentation: n   Bronchoalveolar lavage was done x 2 at left lower lobe for cytology  Post procedure Diagnosis:   Mucus plugging of airways     Robotic  Navigational Bronchoscopy Procedure Findings:  Post appropriate planning and registration peripheral navigation was used to visualize target lesion.    Target 1 - Left lower lobe - cytobrush x 1 endobronchial.  FNA transbrionchial x 3. Surgical pathology forceps transbronchial x 4 - ATYPICAL CELLS CONSISTENT WITH CARCINOMA   Post procedure diagnosis: LUNG CANCER      Endobronchial ultrasound assisted hilar and mediastinal lymph node biopsies procedure findings: The fiberoptic bronchoscope was removed and the EBUS scope was introduced. All lymph node stations appeared to be normal with <53mm in diameter and healthy fatty core.  No biopsies performed.    Bronchoscopy with placement of  fiducial markers   - fiducial marker x 1 placed transbronchially at LLL 23mm lesion     FIDUCIAL MARKER PLACED AT LLL LESION  Post procedure  diagnosis:  lung cancer   Specimens obtained included:   Fluoroscopy Used: yes ;        Pictorial documentation attached: yes                 Transbronchial WANG needle aspiration site: LLL sent for: cytology                                     Epinephrine ZERO ml was used topically  The bronchoscopy was terminated due to completion of the planned procedure and the bronchoscope was removed.   Total dosage of Lidocaine  was 3mg  Total fluoroscopy time was AS PER RADIOLOGY  minutes  Supplemental oxygen was provided at AS PER ANESTHESIA  lpm by nasal canula post operatively  Estimated Blood loss: EXPECTED <1cc.  Complications included:  NONE IMMEDIATE   Preliminary CXR findings :  REVIEWED NO PNEUMOTHORAX  Disposition: HOME   Follow up with Dr. Melesio Madara in 5 days for result discussion.     Erskin Hearing MD  [facility] Division of Pulmonary & Critical Care Medicine

## 2023-07-19 NOTE — Anesthesia Postprocedure Evaluation (Signed)
 Anesthesia Post Note  Patient: Crystal Barrett  Procedure(s) Performed: BRONCHOSCOPY, WITH EBUS VIDEO BRONCHOSCOPY WITH ENDOBRONCHIAL NAVIGATION  Patient location during evaluation: PACU Anesthesia Type: General Level of consciousness: awake Pain management: pain level controlled Respiratory status: spontaneous breathing Cardiovascular status: stable Anesthetic complications: no   No notable events documented.   Last Vitals:  Vitals:   07/19/23 0940 07/19/23 0948  BP: 114/63 (!) 110/59  Pulse: 92   Resp: (!) 26 16  Temp: (!) 36.2 C   SpO2: 95% 94%    Last Pain:  Vitals:   07/19/23 0940  TempSrc:   PainSc: 0-No pain                 VAN STAVEREN,Sarha Bartelt

## 2023-07-19 NOTE — Anesthesia Procedure Notes (Signed)
 Procedure Name: Intubation Date/Time: 07/19/2023 8:10 AM  Performed by: Lorriane Arabia, CRNAPre-anesthesia Checklist: Patient identified, Patient being monitored, Timeout performed, Emergency Drugs available and Suction available Patient Re-evaluated:Patient Re-evaluated prior to induction Oxygen Delivery Method: Circle system utilized Preoxygenation: Pre-oxygenation with 100% oxygen Induction Type: IV induction Ventilation: Mask ventilation without difficulty Laryngoscope Size: 3 and McGrath Grade View: Grade I Tube type: Oral Tube size: 8.0 mm Number of attempts: 1 Airway Equipment and Method: Stylet Placement Confirmation: ETT inserted through vocal cords under direct vision, positive ETCO2 and breath sounds checked- equal and bilateral Secured at: 21 cm Tube secured with: Tape Dental Injury: Teeth and Oropharynx as per pre-operative assessment

## 2023-07-19 NOTE — Anesthesia Preprocedure Evaluation (Signed)
 Anesthesia Evaluation  Patient identified by MRN, date of birth, ID band Patient awake    Reviewed: Allergy & Precautions, NPO status , Patient's Chart, lab work & pertinent test results  History of Anesthesia Complications (+) PONV and history of anesthetic complications  Airway Mallampati: III  TM Distance: <3 FB Neck ROM: full    Dental  (+) Upper Dentures, Lower Dentures   Pulmonary neg pulmonary ROS, COPD,  COPD inhaler, former smoker   Pulmonary exam normal breath sounds clear to auscultation       Cardiovascular Exercise Tolerance: Good negative cardio ROS Normal cardiovascular exam Rhythm:Regular Rate:Normal     Neuro/Psych negative neurological ROS  negative psych ROS   GI/Hepatic negative GI ROS, Neg liver ROS,GERD  Medicated,,  Endo/Other  negative endocrine ROS    Renal/GU negative Renal ROS  negative genitourinary   Musculoskeletal negative musculoskeletal ROS (+)    Abdominal Normal abdominal exam  (+)   Peds negative pediatric ROS (+)  Hematology negative hematology ROS (+)   Anesthesia Other Findings Past Medical History: No date: Acid reflux No date: Allergy No date: Asthma No date: Centrilobular emphysema (HCC) No date: Chicken pox No date: Chronic Helicobacter pylori gastritis No date: Eczema No date: Former cigarette smoker 01/2020: History of colon cancer No date: Lung nodule No date: Osteopenia No date: PONV (postoperative nausea and vomiting) No date: Vapes nicotine containing substance  Past Surgical History: No date: COLECTOMY     Comment:  with ileostomy 2010: COLONOSCOPY     Comment:  Diverticulitis. No polyps.  No date: ILEOSTOMY CLOSURE No date: INNER EAR SURGERY 2000: UPPER GASTROINTESTINAL ENDOSCOPY No date: WISDOM TOOTH EXTRACTION  BMI    Body Mass Index: 22.45 kg/m      Reproductive/Obstetrics negative OB ROS                              Anesthesia Physical Anesthesia Plan  ASA: 3  Anesthesia Plan: General   Post-op Pain Management:    Induction: Intravenous  PONV Risk Score and Plan: Ondansetron , Dexamethasone , Midazolam  and Treatment may vary due to age or medical condition  Airway Management Planned: Oral ETT  Additional Equipment:   Intra-op Plan:   Post-operative Plan: Extubation in OR  Informed Consent: I have reviewed the patients History and Physical, chart, labs and discussed the procedure including the risks, benefits and alternatives for the proposed anesthesia with the patient or authorized representative who has indicated his/her understanding and acceptance.     Dental Advisory Given  Plan Discussed with: CRNA  Anesthesia Plan Comments:        Anesthesia Quick Evaluation

## 2023-07-22 ENCOUNTER — Encounter: Payer: Self-pay | Admitting: Pulmonary Disease

## 2023-07-22 LAB — CYTOLOGY - NON PAP

## 2023-07-23 LAB — SURGICAL PATHOLOGY

## 2023-07-24 NOTE — Therapy (Signed)
 Dr. Jaclynn Mast placed fiducial marker x 1 placed transbronchially at LLL 23mm lesion. ZOX#[id number] EXP 01/07/27 Nitinol Coil 4mm x 7mm.  No adverse reactions noted at the time.  Bronch assisted by Suszanne Eriksson, RCP.

## 2023-07-29 ENCOUNTER — Encounter: Payer: Self-pay | Admitting: *Deleted

## 2023-07-29 ENCOUNTER — Inpatient Hospital Stay (HOSPITAL_BASED_OUTPATIENT_CLINIC_OR_DEPARTMENT_OTHER): Admitting: Oncology

## 2023-07-29 ENCOUNTER — Ambulatory Visit
Admission: RE | Admit: 2023-07-29 | Discharge: 2023-07-29 | Disposition: A | Discharge status: Home / Self Care | Source: Ambulatory Visit | Attending: Radiation Oncology | Admitting: Radiation Oncology

## 2023-07-29 ENCOUNTER — Encounter: Payer: Self-pay | Admitting: Oncology

## 2023-07-29 ENCOUNTER — Inpatient Hospital Stay: Attending: Oncology

## 2023-07-29 VITALS — BP 121/80 | HR 76 | Temp 97.2°F | Resp 16 | Ht 65.0 in | Wt 132.8 lb

## 2023-07-29 DIAGNOSIS — Z79899 Other long term (current) drug therapy: Secondary | ICD-10-CM | POA: Insufficient documentation

## 2023-07-29 DIAGNOSIS — Z8041 Family history of malignant neoplasm of ovary: Secondary | ICD-10-CM | POA: Diagnosis not present

## 2023-07-29 DIAGNOSIS — F17290 Nicotine dependence, other tobacco product, uncomplicated: Secondary | ICD-10-CM | POA: Insufficient documentation

## 2023-07-29 DIAGNOSIS — Z809 Family history of malignant neoplasm, unspecified: Secondary | ICD-10-CM | POA: Insufficient documentation

## 2023-07-29 DIAGNOSIS — Z87891 Personal history of nicotine dependence: Secondary | ICD-10-CM | POA: Insufficient documentation

## 2023-07-29 DIAGNOSIS — R1310 Dysphagia, unspecified: Secondary | ICD-10-CM | POA: Diagnosis not present

## 2023-07-29 DIAGNOSIS — C3432 Malignant neoplasm of lower lobe, left bronchus or lung: Secondary | ICD-10-CM | POA: Diagnosis present

## 2023-07-29 DIAGNOSIS — Z85038 Personal history of other malignant neoplasm of large intestine: Secondary | ICD-10-CM | POA: Diagnosis not present

## 2023-07-29 DIAGNOSIS — J432 Centrilobular emphysema: Secondary | ICD-10-CM | POA: Insufficient documentation

## 2023-07-29 DIAGNOSIS — C3490 Malignant neoplasm of unspecified part of unspecified bronchus or lung: Secondary | ICD-10-CM

## 2023-07-29 NOTE — Progress Notes (Signed)
 Met with patient during initial consult with Dr. Adrian Alba and Dr. Jacalyn Martin. All questions answered during visit. Reviewed upcoming appts. Contact info given and instructed to call with any questions or needs. Pt verbalized understanding.

## 2023-07-29 NOTE — Progress Notes (Signed)
 Patient presents today with lung cancer. States she is here to see what the options are. Does have a history of colon cancer back in 2022. Stopped smoking about 2 months ago.

## 2023-07-29 NOTE — Consult Note (Signed)
 NEW PATIENT EVALUATION  Name: Crystal Barrett  MRN: [id number]  Date:   07/29/2023     DOB: 1955-05-02   This 68 y.o. 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  REFERRING PHYSICIAN: Eartha Gold, MD  CHIEF COMPLAINT:  Chief Complaint  Patient presents with   Lung Cancer    DIAGNOSIS: The encounter diagnosis was Malignant neoplasm of lower lobe, left bronchus or lung (HCC).   PREVIOUS INVESTIGATIONS:  CT scan reviewed PET CT scan ordered Clinical notes reviewed Pathology cytology reports reviewed  HPI: Patient is a 68 year old female who presented with a left lower lobe lesion measuring 2.3 cm part solid and suspicious for part possible malignancy.  Patient has a history of COPD emphysema.  Patient underwent bronchoscopy with pathology positive for adenocarcinoma.  At the time of bronchoscopy fiberoptic EBUS was performed showing all lymph node stations appearing to be normal no biopsies were performed.  Patient is seen today for evaluation.  She has a PET CT scan scheduled for early next week.  She specifically Nuys any dysphagia cough hemoptysis or chest tightness.  Her p.o. intake is good and weight is stable.  PLANNED TREATMENT REGIMEN: Probable SBRT  PAST MEDICAL HISTORY:  has a past medical history of Acid reflux, Allergy, Asthma, Centrilobular emphysema (HCC), Chicken pox, Chronic Helicobacter pylori gastritis, Colon cancer (HCC) (01/2020), Eczema, Former cigarette smoker, History of colon cancer (01/2020), Lung nodule, Osteopenia, PONV (postoperative nausea and vomiting), Skin cancer (2024), and Vapes nicotine containing substance.    PAST SURGICAL HISTORY:  Past Surgical History:  Procedure Laterality Date   COLECTOMY     with ileostomy   COLONOSCOPY  2010   Diverticulitis. No polyps.    ILEOSTOMY CLOSURE     INNER EAR SURGERY     UPPER GASTROINTESTINAL ENDOSCOPY  2000    VIDEO BRONCHOSCOPY WITH ENDOBRONCHIAL NAVIGATION N/A 07/19/2023   Procedure: VIDEO BRONCHOSCOPY WITH ENDOBRONCHIAL NAVIGATION;  Surgeon: Erskin Hearing, MD;  Location: [facility];  Service: Thoracic;  Laterality: N/A;   VIDEO BRONCHOSCOPY WITH ENDOBRONCHIAL ULTRASOUND N/A 07/19/2023   Procedure: BRONCHOSCOPY, WITH EBUS;  Surgeon: Erskin Hearing, MD;  Location: [facility] ORS;  Service: Thoracic;  Laterality: N/A;   WISDOM TOOTH EXTRACTION      FAMILY HISTORY: family history includes Cancer in her father; Diabetes in her daughter; Heart disease in her mother; Ovarian cancer in her maternal aunt.  SOCIAL HISTORY:  reports that she quit smoking about 17 years ago. Her smoking use included cigarettes. She started smoking about 2 months ago. She has a 0.1 pack-year smoking history. She has never used smokeless tobacco. She reports that she does not currently use alcohol. She reports that she does not use drugs.  ALLERGIES: Codeine, Chlorhexidine, and Other  MEDICATIONS:  Current Outpatient Medications  Medication Sig Dispense Refill   azelastine (ASTELIN) 0.1 % nasal spray Place 1 spray into both nostrils as needed for allergies. Use in each nostril as directed     clobetasol cream (TEMOVATE) 0.05 % Apply 1 application  topically as needed.     Multiple Vitamins-Minerals (ALIVE ONCE DAILY WOMENS 50+ PO) Take 1 tablet by mouth daily.     omeprazole  (PRILOSEC) 40 MG capsule Take 1 capsule (40 mg total) by mouth daily. (Patient taking differently: Take 40 mg by mouth every morning.) 90 capsule 3   traZODone  (DESYREL ) 100 MG tablet Take 200 mg by mouth at bedtime.  No current facility-administered medications for this encounter.    ECOG PERFORMANCE STATUS:  0 - Asymptomatic  REVIEW OF SYSTEMS: Patient does have a history of COPD, acid reflux chronic Helicobacter pylori gastritis Patient denies any weight loss, fatigue, weakness, fever, chills or night sweats. Patient denies any loss of vision,  blurred vision. Patient denies any ringing  of the ears or hearing loss. No irregular heartbeat. Patient denies heart murmur or history of fainting. Patient denies any chest pain or pain radiating to her upper extremities. Patient denies any shortness of breath, difficulty breathing at night, cough or hemoptysis. Patient denies any swelling in the lower legs. Patient denies any nausea vomiting, vomiting of blood, or coffee ground material in the vomitus. Patient denies any stomach pain. Patient states has had normal bowel movements no significant constipation or diarrhea. Patient denies any dysuria, hematuria or significant nocturia. Patient denies any problems walking, swelling in the joints or loss of balance. Patient denies any skin changes, loss of hair or loss of weight. Patient denies any excessive worrying or anxiety or significant depression. Patient denies any problems with insomnia. Patient denies excessive thirst, polyuria, polydipsia. Patient denies any swollen glands, patient denies easy bruising or easy bleeding. Patient denies any recent infections, allergies or URI. Patient "s visual fields have not changed significantly in recent time.   PHYSICAL EXAM: There were no vitals taken for this visit. Well-developed well-nourished patient in NAD. HEENT reveals PERLA, EOMI, discs not visualized.  Oral cavity is clear. No oral mucosal lesions are identified. Neck is clear without evidence of cervical or supraclavicular adenopathy. Lungs are clear to A&P. Cardiac examination is essentially unremarkable with regular rate and rhythm without murmur rub or thrill. Abdomen is benign with no organomegaly or masses noted. Motor sensory and DTR levels are equal and symmetric in the upper and lower extremities. Cranial nerves II through XII are grossly intact. Proprioception is intact. No peripheral adenopathy or edema is identified. No motor or sensory levels are noted. Crude visual fields are within normal  range.  LABORATORY DATA: Cytology pathology reports reviewed    RADIOLOGY RESULTS: CT scan reviewed compatible with above-stated findings PET scan will be reviewed prior to initiating simulation   IMPRESSION: Stage I A3 adenocarcinoma of the left lower lobe in 68 year old female.  PLAN: At this time I have recommended probable SBRT to her left lower lobe.  Will plan on delivering 60 Gray in 5 fractions.  I would like to review her PET CT scan to rule out any possibility of upstage disease prior to initiating simulation.  I have scheduled her for simulation after her PET scan is performed.  Risks and benefits of treatment including possible development of a cough possible fatigue possible slight alteration of breathing pattern all were reviewed in detail with the patient and her husband.  They both seem to comprehend my treatment plan well.  I have set her up for simulation the end of next week after her PET CT scan is performed.  Will review that prior to initiating simulation.  Patient husband both comprehend my treatment plan well.  I would like to take this opportunity to thank you for allowing me to participate in the care of your patient.Glenis Langdon, MD

## 2023-07-29 NOTE — Progress Notes (Signed)
 [facility]  [phone] [phone]) [phone]  ID: Crystal Barrett OB: [birth year]  MR#: [id number]  MWU#:[id number]  Patient Care Team: Eartha Gold, MD as PCP - General (Infectious Diseases) Drake Gens, RN as Oncology Nurse Navigator Glenis Langdon, MD as Consulting Physician (Radiation Oncology) Shellie Dials, MD as Consulting Physician (Oncology)  CHIEF COMPLAINT: Stage IA3 adenocarcinoma of the left lower lobe lung  INTERVAL HISTORY: Patient is a 68 year old female with a distant history of colon cancer who was noted to have a pulmonary nodule on lung screening CT scan.  Subsequent bronchoscopy and biopsy confirmed the above-stated malignancy.  She currently feels well and is asymptomatic.  She has no neurologic complaints.  She denies any recent fevers or illnesses.  She has a good appetite and denies weight loss.  She has no chest pain, shortness of breath, cough, or hemoptysis.  She denies any nausea, vomiting, constipation, or diarrhea.  She has no urinary complaints.  Patient feels at her baseline and offers no specific complaints today.  REVIEW OF SYSTEMS:   Review of Systems  Constitutional: Negative.  Negative for fever, malaise/fatigue and weight loss.  Respiratory: Negative.  Negative for cough, hemoptysis and shortness of breath.   Cardiovascular: Negative.  Negative for chest pain and leg swelling.  Gastrointestinal: Negative.  Negative for abdominal pain.  Genitourinary: Negative.  Negative for dysuria.  Musculoskeletal: Negative.  Negative for back pain.  Skin: Negative.  Negative for rash.  Neurological: Negative.  Negative for dizziness, focal weakness, weakness and headaches.  Psychiatric/Behavioral: Negative.  The patient is not nervous/anxious.     As per HPI. Otherwise, a complete review of systems is negative.  PAST MEDICAL HISTORY: Past Medical History:  Diagnosis Date   Acid reflux    Allergy    Asthma     Centrilobular emphysema (HCC)    Chicken pox    Chronic Helicobacter pylori gastritis    Colon cancer (HCC) 01/2020   Eczema    Former cigarette smoker    History of colon cancer 01/2020   Lung nodule    Osteopenia    PONV (postoperative nausea and vomiting)    Skin cancer 2024   Right arm   Vapes nicotine containing substance     PAST SURGICAL HISTORY: Past Surgical History:  Procedure Laterality Date   COLECTOMY     with ileostomy   COLONOSCOPY  2010   Diverticulitis. No polyps.    ILEOSTOMY CLOSURE     INNER EAR SURGERY     UPPER GASTROINTESTINAL ENDOSCOPY  2000   VIDEO BRONCHOSCOPY WITH ENDOBRONCHIAL NAVIGATION N/A 07/19/2023   Procedure: VIDEO BRONCHOSCOPY WITH ENDOBRONCHIAL NAVIGATION;  Surgeon: Erskin Hearing, MD;  Location: [facility] ORS;  Service: Thoracic;  Laterality: N/A;   VIDEO BRONCHOSCOPY WITH ENDOBRONCHIAL ULTRASOUND N/A 07/19/2023   Procedure: BRONCHOSCOPY, WITH EBUS;  Surgeon: Erskin Hearing, MD;  Location: [facility] ORS;  Service: Thoracic;  Laterality: N/A;   WISDOM TOOTH EXTRACTION      FAMILY HISTORY: Family History  Problem Relation Age of Onset   Heart disease Mother    Cancer Father        Brain cancer   Ovarian cancer Maternal Aunt    Diabetes Daughter    Breast cancer Neg Hx    Colon cancer Neg Hx     ADVANCED DIRECTIVES (Y/N):  N  HEALTH MAINTENANCE: Social History   Tobacco Use   Smoking status: Former    Current packs/day: 0.50    Average packs/day: 0.5  packs/day for 0.2 years (0.1 ttl pk-yrs)    Types: Cigarettes    Start date: 05/08/2023    Quit date: 06/04/2006   Smokeless tobacco: Never  Vaping Use   Vaping status: Every Day   Substances: Nicotine, Flavoring  Substance Use Topics   Alcohol use: Not Currently    Comment: Occassionally   Drug use: No     Colonoscopy:  PAP:  Bone density:  Lipid panel:  Allergies  Allergen Reactions   Codeine Nausea And Vomiting   Chlorhexidine Itching    Causes itching and burning   Other  Rash    Vinyl Gloves-Flares Eczema    Current Outpatient Medications  Medication Sig Dispense Refill   azelastine (ASTELIN) 0.1 % nasal spray Place 1 spray into both nostrils as needed for allergies. Use in each nostril as directed     clobetasol cream (TEMOVATE) 0.05 % Apply 1 application  topically as needed.     Multiple Vitamins-Minerals (ALIVE ONCE DAILY WOMENS 50+ PO) Take 1 tablet by mouth daily.     omeprazole  (PRILOSEC) 40 MG capsule Take 1 capsule (40 mg total) by mouth daily. (Patient taking differently: Take 40 mg by mouth every morning.) 90 capsule 3   traZODone  (DESYREL ) 100 MG tablet Take 200 mg by mouth at bedtime.     No current facility-administered medications for this visit.    OBJECTIVE: Vitals:   07/29/23 1323  BP: 121/80  Pulse: 76  Resp: 16  Temp: (!) 97.2 F (36.2 C)  SpO2: 98%     Body mass index is 22.1 kg/m.    ECOG FS:0 - Asymptomatic  General: Well-developed, well-nourished, no acute distress. Eyes: Pink conjunctiva, anicteric sclera. HEENT: Normocephalic, moist mucous membranes. Lungs: No audible wheezing or coughing. Heart: Regular rate and rhythm. Abdomen: Soft, nontender, no obvious distention. Musculoskeletal: No edema, cyanosis, or clubbing. Neuro: Alert, answering all questions appropriately. Cranial nerves grossly intact. Skin: No rashes or petechiae noted. Psych: Normal affect. Lymphatics: No cervical, calvicular, axillary or inguinal LAD.   LAB RESULTS:  Lab Results  Component Value Date   NA 135 07/17/2023   K 4.1 07/17/2023   CL 104 07/17/2023   CO2 25 07/17/2023   GLUCOSE 129 (H) 07/17/2023   BUN 13 07/17/2023   CREATININE 0.95 07/17/2023   CALCIUM 9.2 07/17/2023   PROT 6.8 09/30/2020   ALBUMIN 4.7 09/30/2020   AST 30 09/30/2020   ALT 26 09/30/2020   ALKPHOS 95 09/30/2020   BILITOT 0.4 09/30/2020   GFRNONAA >60 07/17/2023    Lab Results  Component Value Date   WBC 6.2 07/17/2023   NEUTROABS 3.2 09/30/2020    HGB 13.5 07/17/2023   HCT 40.3 07/17/2023   MCV 96.4 07/17/2023   PLT 255 07/17/2023     STUDIES: DG Chest Port 1 View Result Date: 07/19/2023 CLINICAL DATA:  Status post bronchoscopy EXAM: PORTABLE CHEST 1 VIEW COMPARISON:  CT chest dated 06/20/2023 FINDINGS: Normal lung volumes. Biopsy clip in the medial left lower lung. Minimal patchy left basilar opacity. No pleural effusion or pneumothorax. The heart size and mediastinal contours are within normal limits. No acute osseous abnormality. IMPRESSION: 1. No pneumothorax. 2. Biopsy clip in the medial left lower lung. Minimal patchy left basilar opacity, which may represent atelectasis or post biopsy changes. Electronically Signed   By: Limin  Xu M.D.   On: 07/19/2023 10:53   DG C-Arm 1-60 Min-No Report Result Date: 07/19/2023 Fluoroscopy was utilized by the requesting physician.  No radiographic interpretation.  DG C-Arm 1-60 Min-No Report Result Date: 07/19/2023 Fluoroscopy was utilized by the requesting physician.  No radiographic interpretation.    ASSESSMENT: Stage IA3 adenocarcinoma of the left lower lobe lung  PLAN:    Stage IA3 adenocarcinoma of the left lower lobe lung: Diagnosis confirmed by biopsy on bronchoscopy.  Patient has a PET scan to complete her staging workup on Aug 06, 2023.  She refuses any surgical intervention and has an appointment with radiation oncology this afternoon.  She does not require any concurrent chemotherapy at this time.  Pending results of the PET scan, patient will follow-up at the end of XRT for further evaluation and discussion of active surveillance.  I spent a total of 60 minutes reviewing chart data, face-to-face evaluation with the patient, counseling and coordination of care as detailed above.   Patient expressed understanding and was in agreement with this plan. She also understands that She can call clinic at any time with any questions, concerns, or complaints.    Cancer Staging   Adenocarcinoma of lower lobe of left lung [facility]) Staging form: Lung, AJCC V9 - Clinical stage from 07/29/2023: Stage IA3 (cT1c, cN0, cM0) - Signed by Shellie Dials, MD on 07/29/2023 Stage prefix: Initial diagnosis   Shellie Dials, MD   07/29/2023 4:59 PM

## 2023-08-06 ENCOUNTER — Ambulatory Visit
Admission: RE | Admit: 2023-08-06 | Discharge: 2023-08-06 | Disposition: A | Discharge status: Home / Self Care | Source: Ambulatory Visit | Attending: Oncology | Admitting: Oncology

## 2023-08-06 DIAGNOSIS — C3490 Malignant neoplasm of unspecified part of unspecified bronchus or lung: Secondary | ICD-10-CM

## 2023-08-06 DIAGNOSIS — C3432 Malignant neoplasm of lower lobe, left bronchus or lung: Secondary | ICD-10-CM

## 2023-08-06 DIAGNOSIS — I700 Atherosclerosis of aorta: Secondary | ICD-10-CM | POA: Diagnosis not present

## 2023-08-06 DIAGNOSIS — R911 Solitary pulmonary nodule: Secondary | ICD-10-CM | POA: Diagnosis not present

## 2023-08-06 LAB — GLUCOSE, CAPILLARY: Glucose-Capillary: 82 mg/dL (ref 70–99)

## 2023-08-06 MED ORDER — FLUDEOXYGLUCOSE F - 18 (FDG) INJECTION
6.8000 | Freq: Once | INTRAVENOUS | Status: AC | PRN
  Administered 2023-08-06: 7.3 via INTRAVENOUS

## 2023-08-07 ENCOUNTER — Inpatient Hospital Stay: Attending: Oncology | Admitting: Hospice and Palliative Medicine

## 2023-08-07 DIAGNOSIS — C3432 Malignant neoplasm of lower lobe, left bronchus or lung: Secondary | ICD-10-CM

## 2023-08-07 NOTE — Progress Notes (Signed)
 Multidisciplinary Oncology Council Documentation  SHADEA INGERMAN was presented by our [facility] on 08/07/2023, which included representatives from:  Palliative Care Dietitian  Physical/Occupational Therapist Nurse Navigator Genetics Social work Survivorship RN Financial Navigator Research RN   Tera currently presents with history of lung ca  We reviewed previous medical and familial history, history of present illness, and recent lab results along with all available histopathologic and imaging studies. The MOC considered available treatment options and made the following recommendations/referrals:  SW  The MOC is a meeting of clinicians from various specialty areas who evaluate and discuss patients for whom a multidisciplinary approach is being considered. Final determinations in the plan of care are those of the provider(s).   Today's extended care, comprehensive team conference, Sallye was not present for the discussion and was not examined.

## 2023-08-08 ENCOUNTER — Encounter: Payer: Self-pay | Admitting: *Deleted

## 2023-08-08 ENCOUNTER — Ambulatory Visit
Admission: RE | Admit: 2023-08-08 | Discharge: 2023-08-08 | Disposition: A | Discharge status: Home / Self Care | Source: Ambulatory Visit | Attending: Radiation Oncology | Admitting: Radiation Oncology

## 2023-08-08 DIAGNOSIS — Z809 Family history of malignant neoplasm, unspecified: Secondary | ICD-10-CM | POA: Insufficient documentation

## 2023-08-08 DIAGNOSIS — Z79899 Other long term (current) drug therapy: Secondary | ICD-10-CM | POA: Diagnosis not present

## 2023-08-08 DIAGNOSIS — R1310 Dysphagia, unspecified: Secondary | ICD-10-CM | POA: Diagnosis not present

## 2023-08-08 DIAGNOSIS — C3432 Malignant neoplasm of lower lobe, left bronchus or lung: Secondary | ICD-10-CM | POA: Diagnosis present

## 2023-08-08 DIAGNOSIS — Z87891 Personal history of nicotine dependence: Secondary | ICD-10-CM | POA: Diagnosis not present

## 2023-08-08 DIAGNOSIS — J432 Centrilobular emphysema: Secondary | ICD-10-CM | POA: Diagnosis not present

## 2023-08-08 DIAGNOSIS — Z8041 Family history of malignant neoplasm of ovary: Secondary | ICD-10-CM | POA: Insufficient documentation

## 2023-08-12 ENCOUNTER — Telehealth: Payer: Self-pay

## 2023-08-12 NOTE — Telephone Encounter (Signed)
 Clinical Social Work was referred by [facility] for assessment of psychosocial needs.  CSW attempted to contact patient by phone.  Left voicemail with contact information and request for return call.

## 2023-08-15 DIAGNOSIS — C3432 Malignant neoplasm of lower lobe, left bronchus or lung: Secondary | ICD-10-CM | POA: Diagnosis not present

## 2023-08-19 ENCOUNTER — Telehealth: Payer: Self-pay

## 2023-08-19 ENCOUNTER — Encounter: Payer: Self-pay | Admitting: *Deleted

## 2023-08-19 ENCOUNTER — Ambulatory Visit
Admission: RE | Admit: 2023-08-19 | Discharge: 2023-08-19 | Disposition: A | Discharge status: Home / Self Care | Source: Ambulatory Visit | Attending: Radiation Oncology | Admitting: Radiation Oncology

## 2023-08-19 ENCOUNTER — Other Ambulatory Visit: Payer: Self-pay

## 2023-08-19 DIAGNOSIS — C3432 Malignant neoplasm of lower lobe, left bronchus or lung: Secondary | ICD-10-CM | POA: Diagnosis not present

## 2023-08-19 LAB — RAD ONC ARIA SESSION SUMMARY
Course Elapsed Days: 0
Plan Fractions Treated to Date: 1
Plan Prescribed Dose Per Fraction: 12 Gy
Plan Total Fractions Prescribed: 5
Plan Total Prescribed Dose: 60 Gy
Reference Point Dosage Given to Date: 12 Gy
Reference Point Session Dosage Given: 12 Gy
Session Number: 1

## 2023-08-19 NOTE — Telephone Encounter (Signed)
 Clinical Social Work was referred by [facility] for assessment of psychosocial needs.  CSW attempted to contact patient by phone a second time.  Left voicemail with contact information and request for return call.

## 2023-08-21 ENCOUNTER — Ambulatory Visit
Admission: RE | Admit: 2023-08-21 | Discharge: 2023-08-21 | Disposition: A | Discharge status: Home / Self Care | Source: Ambulatory Visit | Attending: Radiation Oncology | Admitting: Radiation Oncology

## 2023-08-21 ENCOUNTER — Other Ambulatory Visit: Payer: Self-pay

## 2023-08-21 DIAGNOSIS — C3432 Malignant neoplasm of lower lobe, left bronchus or lung: Secondary | ICD-10-CM | POA: Diagnosis not present

## 2023-08-21 LAB — RAD ONC ARIA SESSION SUMMARY
Course Elapsed Days: 2
Plan Fractions Treated to Date: 2
Plan Prescribed Dose Per Fraction: 12 Gy
Plan Total Fractions Prescribed: 5
Plan Total Prescribed Dose: 60 Gy
Reference Point Dosage Given to Date: 24 Gy
Reference Point Session Dosage Given: 12 Gy
Session Number: 2

## 2023-08-27 ENCOUNTER — Other Ambulatory Visit: Payer: Self-pay

## 2023-08-27 ENCOUNTER — Ambulatory Visit
Admission: RE | Admit: 2023-08-27 | Discharge: 2023-08-27 | Disposition: A | Discharge status: Home / Self Care | Source: Ambulatory Visit | Attending: Radiation Oncology | Admitting: Radiation Oncology

## 2023-08-27 DIAGNOSIS — C3432 Malignant neoplasm of lower lobe, left bronchus or lung: Secondary | ICD-10-CM | POA: Diagnosis not present

## 2023-08-27 LAB — RAD ONC ARIA SESSION SUMMARY
Course Elapsed Days: 8
Plan Fractions Treated to Date: 3
Plan Prescribed Dose Per Fraction: 12 Gy
Plan Total Fractions Prescribed: 5
Plan Total Prescribed Dose: 60 Gy
Reference Point Dosage Given to Date: 36 Gy
Reference Point Session Dosage Given: 12 Gy
Session Number: 3

## 2023-08-29 ENCOUNTER — Ambulatory Visit
Admission: RE | Admit: 2023-08-29 | Discharge: 2023-08-29 | Disposition: A | Discharge status: Home / Self Care | Source: Ambulatory Visit | Attending: Radiation Oncology | Admitting: Radiation Oncology

## 2023-08-29 ENCOUNTER — Other Ambulatory Visit: Payer: Self-pay

## 2023-08-29 DIAGNOSIS — C3432 Malignant neoplasm of lower lobe, left bronchus or lung: Secondary | ICD-10-CM | POA: Diagnosis not present

## 2023-08-29 LAB — RAD ONC ARIA SESSION SUMMARY
Course Elapsed Days: 10
Plan Fractions Treated to Date: 4
Plan Prescribed Dose Per Fraction: 12 Gy
Plan Total Fractions Prescribed: 5
Plan Total Prescribed Dose: 60 Gy
Reference Point Dosage Given to Date: 48 Gy
Reference Point Session Dosage Given: 12 Gy
Session Number: 4

## 2023-09-02 ENCOUNTER — Ambulatory Visit
Admission: RE | Admit: 2023-09-02 | Discharge: 2023-09-02 | Disposition: A | Discharge status: Home / Self Care | Source: Ambulatory Visit | Attending: Radiation Oncology | Admitting: Radiation Oncology

## 2023-09-02 ENCOUNTER — Encounter: Payer: Self-pay | Admitting: Oncology

## 2023-09-02 ENCOUNTER — Inpatient Hospital Stay: Attending: Oncology | Admitting: Oncology

## 2023-09-02 ENCOUNTER — Other Ambulatory Visit: Payer: Self-pay

## 2023-09-02 VITALS — BP 106/72 | HR 86 | Temp 96.5°F | Resp 16 | Ht 65.0 in | Wt 134.9 lb

## 2023-09-02 DIAGNOSIS — C3432 Malignant neoplasm of lower lobe, left bronchus or lung: Secondary | ICD-10-CM | POA: Insufficient documentation

## 2023-09-02 DIAGNOSIS — Z87891 Personal history of nicotine dependence: Secondary | ICD-10-CM | POA: Insufficient documentation

## 2023-09-02 DIAGNOSIS — Z8041 Family history of malignant neoplasm of ovary: Secondary | ICD-10-CM | POA: Diagnosis not present

## 2023-09-02 DIAGNOSIS — R1310 Dysphagia, unspecified: Secondary | ICD-10-CM | POA: Insufficient documentation

## 2023-09-02 DIAGNOSIS — Z79899 Other long term (current) drug therapy: Secondary | ICD-10-CM | POA: Diagnosis not present

## 2023-09-02 DIAGNOSIS — J432 Centrilobular emphysema: Secondary | ICD-10-CM | POA: Diagnosis not present

## 2023-09-02 DIAGNOSIS — Z809 Family history of malignant neoplasm, unspecified: Secondary | ICD-10-CM | POA: Diagnosis not present

## 2023-09-02 LAB — RAD ONC ARIA SESSION SUMMARY
Course Elapsed Days: 14
Plan Fractions Treated to Date: 5
Plan Prescribed Dose Per Fraction: 12 Gy
Plan Total Fractions Prescribed: 5
Plan Total Prescribed Dose: 60 Gy
Reference Point Dosage Given to Date: 60 Gy
Reference Point Session Dosage Given: 12 Gy
Session Number: 5

## 2023-09-02 NOTE — Progress Notes (Signed)
 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  She will receive her fifth and final dose of SBRT later this morning.  Currently feels well and is asymptomatic.  She is tolerating treatments without significant side effects.  She has no neurologic complaints.  She denies any recent fevers or illnesses.  She has a good appetite and denies weight loss.  She has no chest pain, shortness of breath, cough, or hemoptysis.  She denies any nausea, vomiting, constipation, or diarrhea.  She has no urinary complaints.  Patient offers no specific complaints today.  REVIEW OF SYSTEMS:   Review of Systems  Constitutional: Negative.  Negative for fever, malaise/fatigue and weight loss.  Respiratory: Negative.  Negative for cough, hemoptysis and shortness of breath.   Cardiovascular: Negative.  Negative for chest pain and leg swelling.  Gastrointestinal: Negative.  Negative for abdominal pain.  Genitourinary: Negative.  Negative for dysuria.  Musculoskeletal: Negative.  Negative for back pain.  Skin: Negative.  Negative for rash.  Neurological: Negative.  Negative for dizziness, focal weakness, weakness and headaches.  Psychiatric/Behavioral: Negative.  The patient is not nervous/anxious.     As per HPI. Otherwise, a complete review of systems is negative.  PAST MEDICAL HISTORY: Past Medical History:  Diagnosis Date   Acid reflux    Allergy    Asthma    Centrilobular emphysema  (HCC)    Chicken pox    Chronic Helicobacter pylori gastritis    Colon cancer (HCC) 01/2020   Eczema    Former cigarette smoker    History of colon cancer 01/2020   Lung nodule    Osteopenia    PONV (postoperative nausea and vomiting)    Skin cancer 2024   Right arm   Vapes nicotine containing substance     PAST SURGICAL HISTORY: Past Surgical History:  Procedure Laterality Date   COLECTOMY     with ileostomy   COLONOSCOPY  2010   Diverticulitis. No polyps.    ILEOSTOMY CLOSURE     INNER EAR SURGERY     UPPER GASTROINTESTINAL ENDOSCOPY  2000   VIDEO BRONCHOSCOPY WITH ENDOBRONCHIAL NAVIGATION N/A 07/19/2023   Procedure: VIDEO BRONCHOSCOPY WITH ENDOBRONCHIAL NAVIGATION;  Surgeon: Erskin Hearing, MD;  Location: [facility] ORS;  Service: Thoracic;  Laterality: N/A;   VIDEO BRONCHOSCOPY WITH ENDOBRONCHIAL ULTRASOUND N/A 07/19/2023   Procedure: BRONCHOSCOPY, WITH EBUS;  Surgeon: Erskin Hearing, MD;  Location: [facility] ORS;  Service: Thoracic;  Laterality: N/A;   WISDOM TOOTH EXTRACTION      FAMILY HISTORY: Family History  Problem Relation Age of Onset   Heart disease Mother    Cancer Father        Brain cancer   Ovarian cancer Maternal Aunt    Diabetes Daughter    Breast cancer Neg Hx    Colon cancer Neg Hx     ADVANCED DIRECTIVES (Y/N):  N  HEALTH MAINTENANCE: Social History   Tobacco Use   Smoking status: Former    Current packs/day: 0.50    Average packs/day: 0.5 packs/day for 0.3  years (0.2 ttl pk-yrs)    Types: Cigarettes    Start date: 05/08/2023    Quit date: 06/04/2006   Smokeless tobacco: Never  Vaping Use   Vaping status: Every Day   Substances: Nicotine, Flavoring  Substance Use Topics   Alcohol use: Not Currently    Comment: Occassionally   Drug use: No     Colonoscopy:  PAP:  Bone density:  Lipid panel:  Allergies  Allergen Reactions   Codeine Nausea And Vomiting   Chlorhexidine Itching    Causes itching and burning   Other Rash    Vinyl  Gloves-Flares Eczema    Current Outpatient Medications  Medication Sig Dispense Refill   azelastine (ASTELIN) 0.1 % nasal spray Place 1 spray into both nostrils as needed for allergies. Use in each nostril as directed     clobetasol cream (TEMOVATE) 0.05 % Apply 1 application  topically as needed.     Fluticasone -Umeclidin-Vilant (TRELEGY ELLIPTA) 100-62.5-25 MCG/ACT AEPB Inhale 1 puff into the lungs.     Multiple Vitamins-Minerals (ALIVE ONCE DAILY WOMENS 50+ PO) Take 1 tablet by mouth daily.     omeprazole  (PRILOSEC) 40 MG capsule Take 1 capsule (40 mg total) by mouth daily. (Patient taking differently: Take 40 mg by mouth every morning.) 90 capsule 3   traZODone  (DESYREL ) 100 MG tablet Take 200 mg by mouth at bedtime.     No current facility-administered medications for this visit.    OBJECTIVE: Vitals:   09/02/23 1012  BP: 106/72  Pulse: 86  Resp: 16  Temp: (!) 96.5 F (35.8 C)  SpO2: 99%     Body mass index is 22.45 kg/m.    ECOG FS:0 - Asymptomatic  General: Well-developed, well-nourished, no acute distress. Eyes: Pink conjunctiva, anicteric sclera. HEENT: Normocephalic, moist mucous membranes. Lungs: No audible wheezing or coughing. Heart: Regular rate and rhythm. Abdomen: Soft, nontender, no obvious distention. Musculoskeletal: No edema, cyanosis, or clubbing. Neuro: Alert, answering all questions appropriately. Cranial nerves grossly intact. Skin: No rashes or petechiae noted. Psych: Normal affect.  LAB RESULTS:  Lab Results  Component Value Date   NA 135 07/17/2023   K 4.1 07/17/2023   CL 104 07/17/2023   CO2 25 07/17/2023   GLUCOSE 129 (H) 07/17/2023   BUN 13 07/17/2023   CREATININE 0.95 07/17/2023   CALCIUM 9.2 07/17/2023   PROT 6.8 09/30/2020   ALBUMIN 4.7 09/30/2020   AST 30 09/30/2020   ALT 26 09/30/2020   ALKPHOS 95 09/30/2020   BILITOT 0.4 09/30/2020   GFRNONAA >60 07/17/2023    Lab Results  Component Value Date   WBC 6.2 07/17/2023    NEUTROABS 3.2 09/30/2020   HGB 13.5 07/17/2023   HCT 40.3 07/17/2023   MCV 96.4 07/17/2023   PLT 255 07/17/2023     STUDIES: NM PET Image Initial (PI) Skull Base To Thigh Result Date: 08/06/2023 CLINICAL DATA:  Initial treatment strategy for non-small cell lung cancer. EXAM: NUCLEAR MEDICINE PET SKULL BASE TO THIGH TECHNIQUE: 7.3 mCi F-18 FDG was injected intravenously. Full-ring PET imaging was performed from the skull base to thigh after the radiotracer. CT data was obtained and used for attenuation correction and anatomic localization. Fasting blood glucose: 82 mg/dl COMPARISON:  Chest CT 53/66/4403 and 06/20/2023. Abdominopelvic CT 12/03/2019. FINDINGS: Mediastinal blood pool activity: SUV max 2.9 NECK: No hypermetabolic cervical lymph nodes are identified. No suspicious activity identified within the pharyngeal mucosal space. Incidental CT findings: none CHEST: There are no hypermetabolic mediastinal, hilar or axillary  lymph nodes. There is no significant hypermetabolic activity associated with the left lower lobe ground-glass nodule (SUV max 1.9). There is a new biopsy clip/fiducial marker within this lesion which otherwise appears unchanged, measuring approximately 1.8 x 1.7 cm on image 54/6. No hypermetabolic pulmonary activity or suspicious nodularity elsewhere. Incidental CT findings: Mild aortic and coronary artery atherosclerosis. Stable small pericardial effusion versus pericardial thickening. ABDOMEN/PELVIS: There is no hypermetabolic activity within the liver, adrenal glands, spleen or pancreas. There is no hypermetabolic nodal activity in the abdomen or pelvis. Incidental CT findings: Postsurgical changes from previous partial colon resection and anastomosis. There are scattered surgical clips. Mild aortic and branch vessel atherosclerosis noted. SKELETON: There is no hypermetabolic activity to suggest osseous metastatic disease. Incidental CT findings: Grossly stable simple lipoma  anteriorly in the right abdominal wall measuring 7.6 cm on image 95/6. IMPRESSION: 1. The recently biopsied left lower lobe ground-glass nodule demonstrates no significant hypermetabolic activity. 2. No significant hypermetabolic activity elsewhere. No evidence of metastatic disease. 3.  Aortic Atherosclerosis (ICD10-I70.0). Electronically Signed   By: Elmon Hagedorn M.D.   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  PET scan completed on Aug 06, 2023 reviewed independently and reported as above with no significant hypermetabolic activity.  Patient will complete 5 doses of SBRT on September 02, 2023.  Return to clinic in 3 months with repeat CT scan and further evaluation.    I spent a total of 20 minutes reviewing chart data, face-to-face evaluation with the patient, counseling and coordination of care as detailed above.   Patient expressed understanding and was in agreement with this plan. She also understands that She can call clinic at any time with any questions, concerns, or complaints.    Cancer Staging  Adenocarcinoma of lower lobe of left lung [facility]) Staging form: Lung, AJCC V9 - Clinical stage from 07/29/2023: Stage IA3 (cT1c, cN0, cM0) - Signed by Shellie Dials, MD on 07/29/2023 Stage prefix: Initial diagnosis   Shellie Dials, MD   09/02/2023 12:41 PM

## 2023-09-03 NOTE — Radiation Completion Notes (Signed)
 Patient Name: Crystal Barrett, Crystal Barrett MRN: [id number] Date of Birth: June 13, 1955 Referring Physician: Emi Hanson, M.D. Date of Service: 2023-09-03 Radiation Oncologist: Glenis Langdon, M.D. [facility]                             RADIATION ONCOLOGY END OF TREATMENT NOTE     Diagnosis: C34.32 Malignant neoplasm of lower lobe, left bronchus or lung Staging on 2023-07-29: Adenocarcinoma of lower lobe of left lung (HCC) T=cT1c, N=cN0, M=cM0 Intent: Curative     HPI: Patient is a 68 year old female who presented with a left lower lobe lesion measuring 2.3 cm part solid and suspicious for part possible malignancy.  Patient has a history of COPD emphysema.  Patient underwent bronchoscopy with pathology positive for adenocarcinoma.  At the time of bronchoscopy fiberoptic EBUS was performed showing all lymph node stations appearing to be normal no biopsies were performed.  Patient is seen today for evaluation.  She has a PET CT scan scheduled for early next week.  She specifically [place] any dysphagia cough hemoptysis or chest tightness.  Her p.o. intake is good and weight is stable.      ==========DELIVERED PLANS==========  First Treatment Date: 2023-08-19 Last Treatment Date: 2023-09-02   Plan Name: Lung_L_SBRT Site: Lung, Left Technique: SBRT/SRT-IMRT Mode: Photon Dose Per Fraction: 12 Gy Prescribed Dose (Delivered / Prescribed): 60 Gy / 60 Gy Prescribed Fxs (Delivered / Prescribed): 5 / 5     ==========ON TREATMENT VISIT DATES========== 2023-08-19, 2023-08-21, 2023-08-27, 2023-08-27, 2023-08-29, 2023-09-02     ==========UPCOMING VISITS========== 10/07/2023 [facility] RAD ONCOLOGY FOLLOW UP 30 [name], Glenn, MD        ==========APPENDIX - ON TREATMENT VISIT NOTES==========   See weekly On Treatment Notes in Epic for details in the Media tab (listed as Progress notes on the On Treatment Visit Dates listed above).

## 2023-09-07 IMAGING — MG MM DIGITAL SCREENING BILAT W/ TOMO AND CAD
8 series · 9 of 24 positions shown · non-contrast
Comparison: Previous exam(s).

CLINICAL DATA: Screening.

EXAM:
DIGITAL SCREENING BILATERAL MAMMOGRAM WITH TOMOSYNTHESIS AND CAD
TECHNIQUE: Bilateral screening digital craniocaudal and mediolateral oblique
mammograms were obtained. Bilateral screening digital breast
tomosynthesis was performed. The images were evaluated with
computer-aided detection.

[R CC synth-2D]
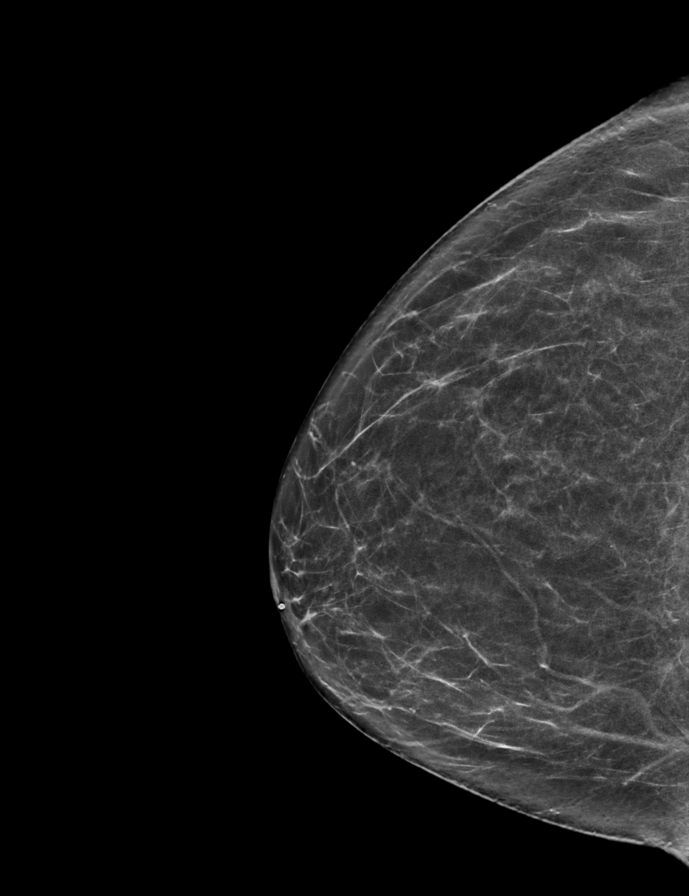

[L CC synth-2D]
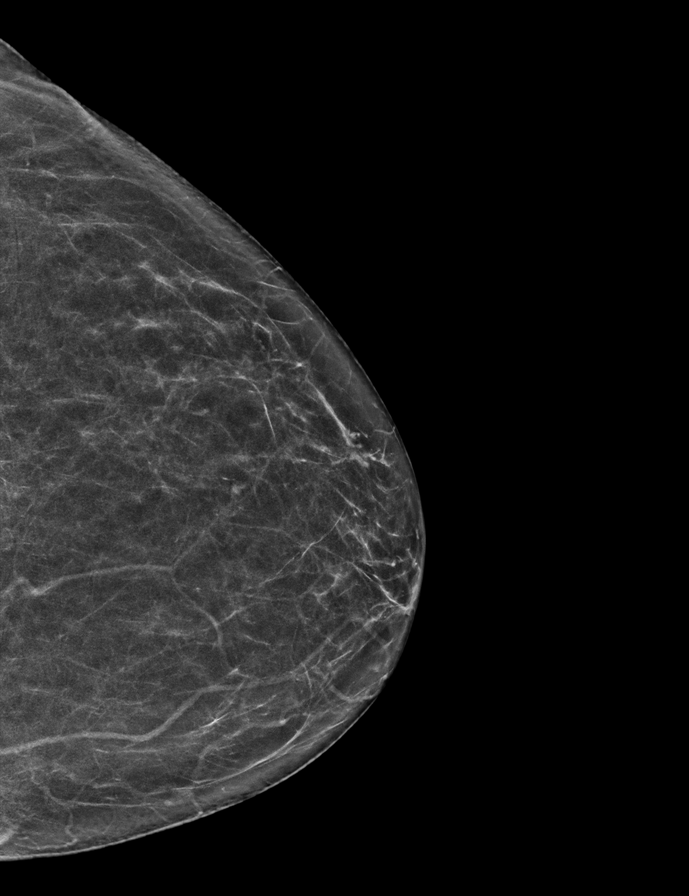

[R MLO synth-2D]
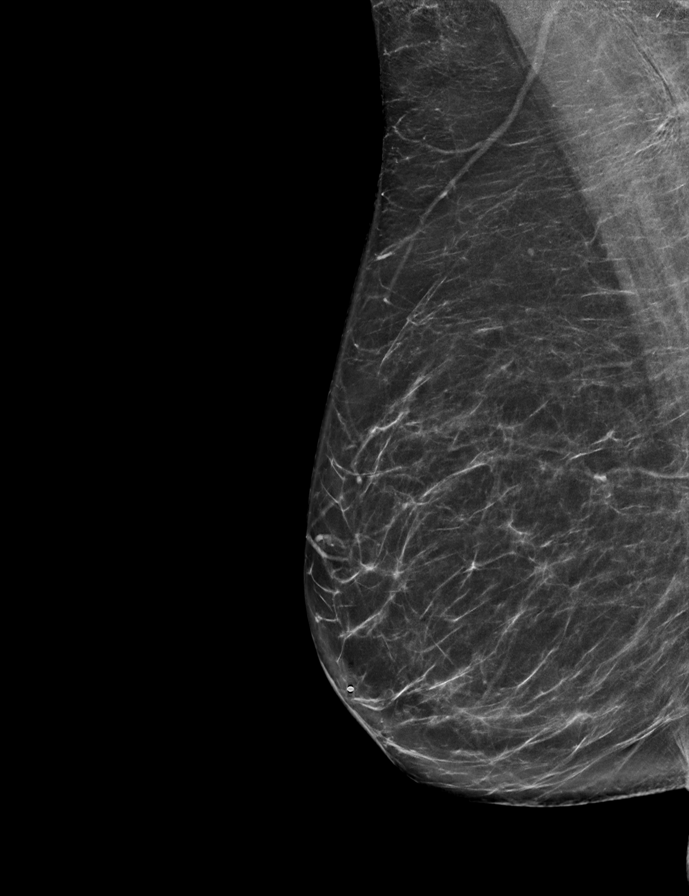

[L MLO synth-2D]
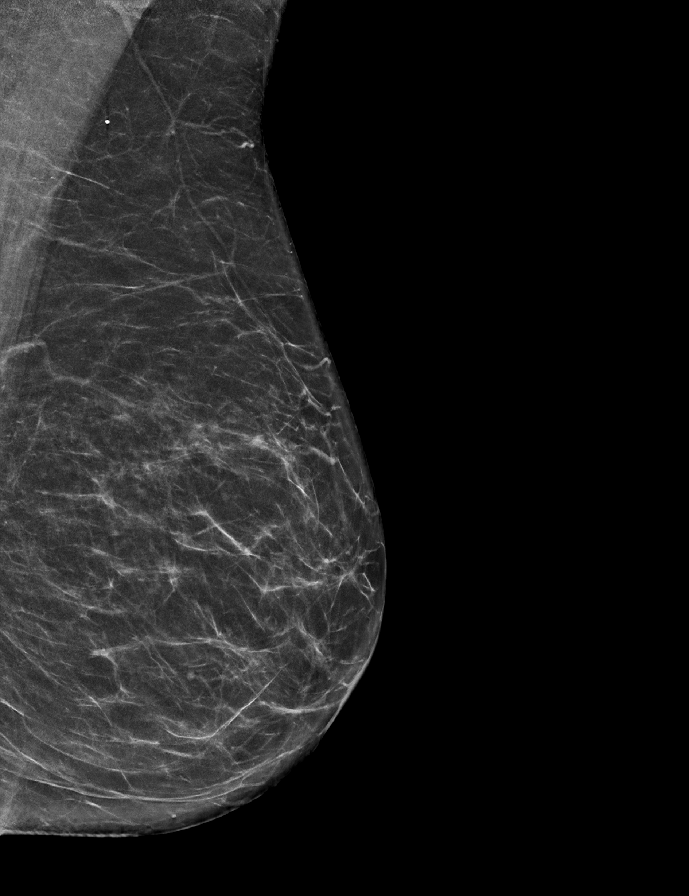

[R MLO tomo · 2 of 73 frames shown]
[frame 24/73]
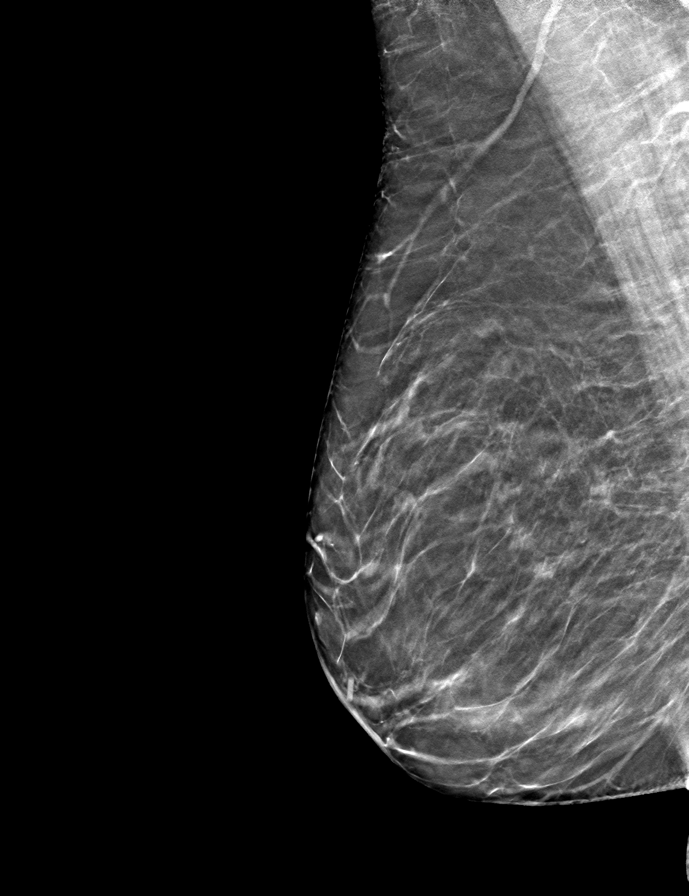
[frame 37/73]
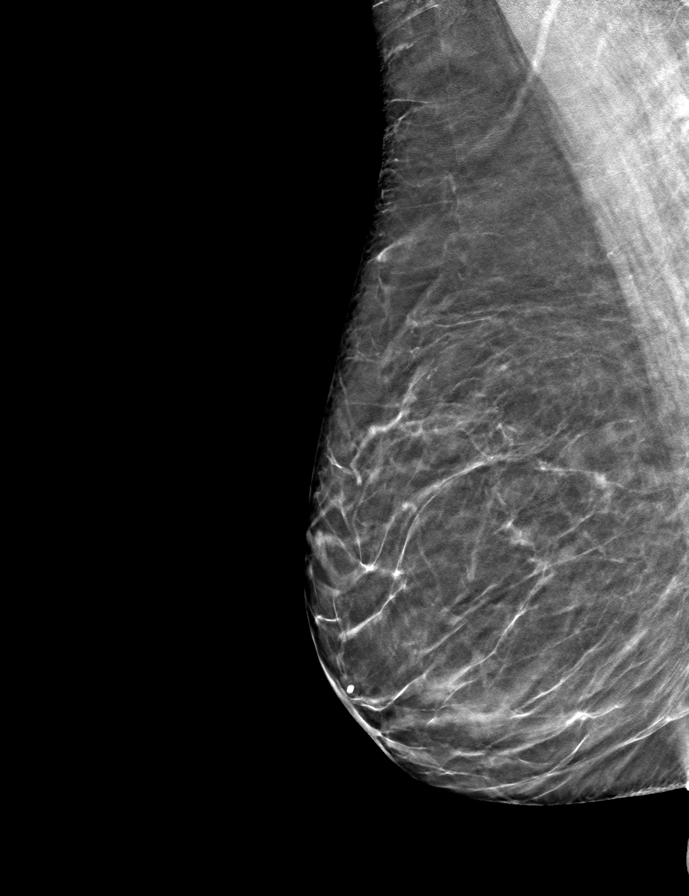

[L CC tomo · tomo slice 30/59.0]
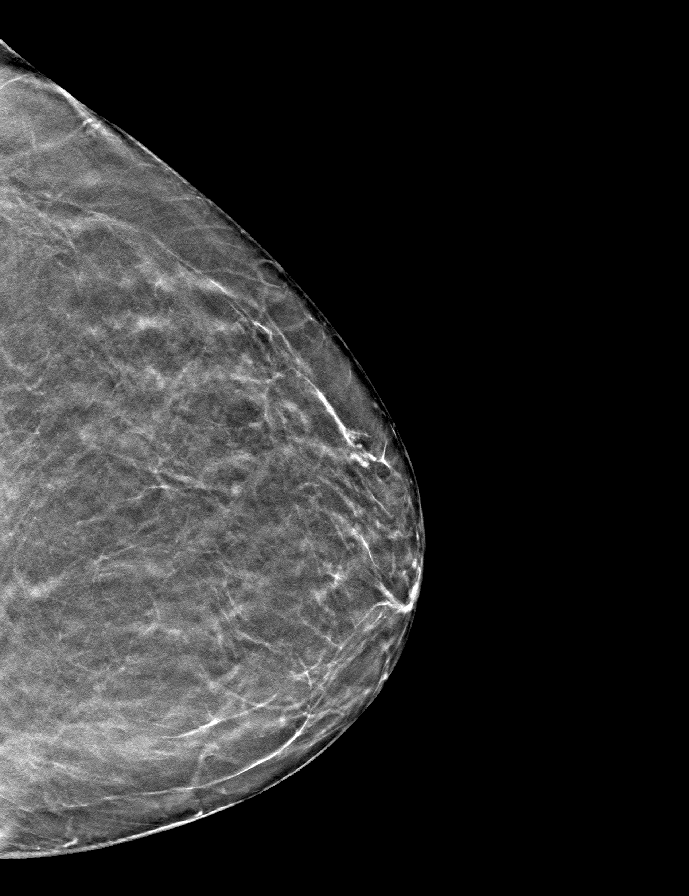

[R CC tomo · tomo slice 31/62.0]
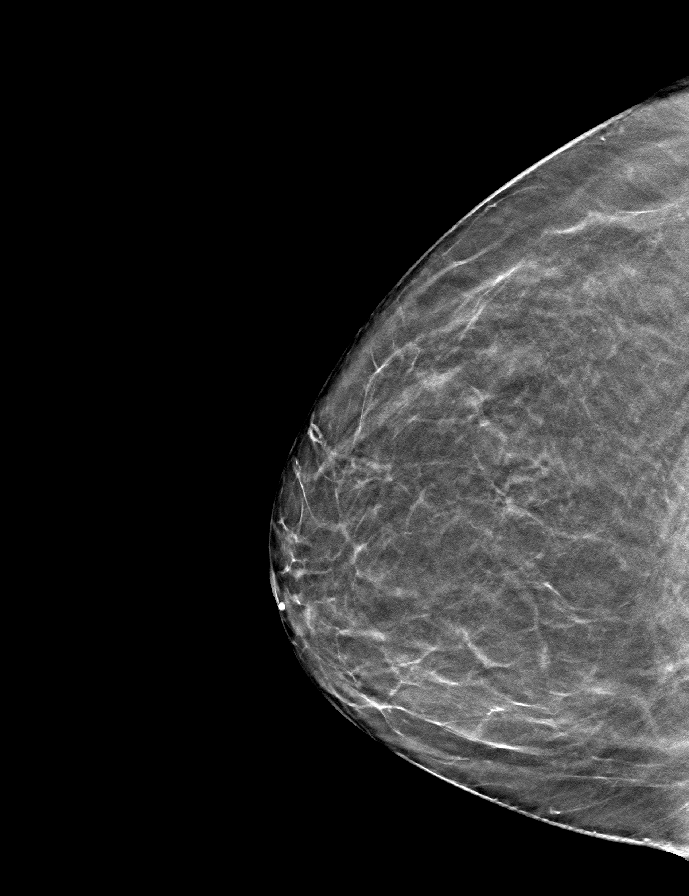

[L MLO tomo · tomo slice 35/69.0]
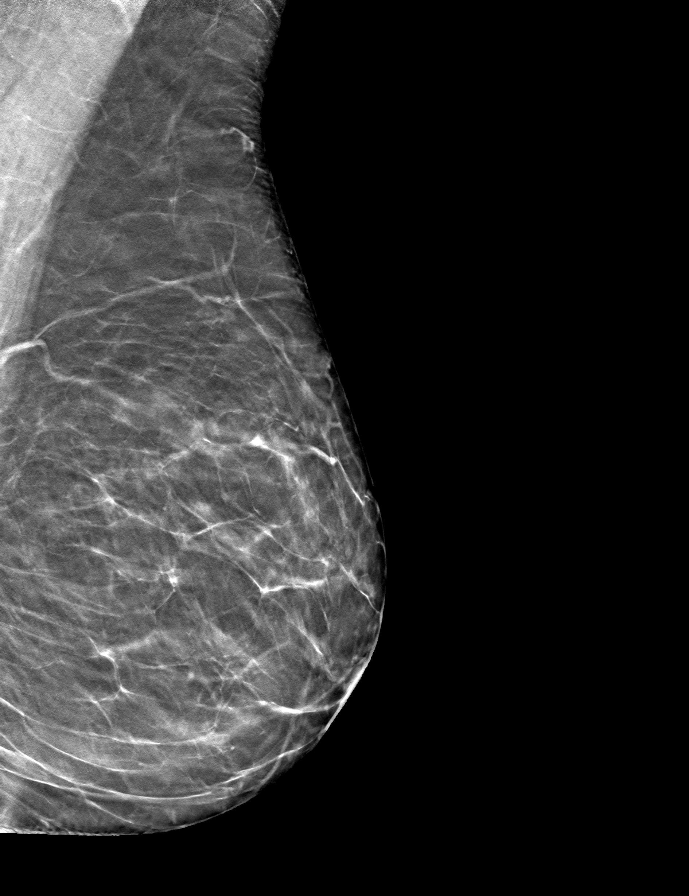

[9 of 24 positions shown; findings below may reference images not displayed]

ACR Breast Density Category b: There are scattered areas of
fibroglandular density.
FINDINGS: There are no findings suspicious for malignancy.
IMPRESSION: No mammographic evidence of malignancy. A result letter of this
screening mammogram will be mailed directly to the patient.

RECOMMENDATION:
Screening mammogram in one year. (Code:[id number]-O-LD2)

BI-RADS CATEGORY  1: Negative.

## 2023-10-07 ENCOUNTER — Encounter: Payer: Self-pay | Admitting: Radiation Oncology

## 2023-10-07 ENCOUNTER — Ambulatory Visit
Admission: RE | Admit: 2023-10-07 | Discharge: 2023-10-07 | Disposition: A | Discharge status: Home / Self Care | Source: Ambulatory Visit | Attending: Radiation Oncology | Admitting: Radiation Oncology

## 2023-10-07 VITALS — BP 108/82 | HR 79 | Temp 98.4°F | Resp 15 | Ht 65.0 in | Wt 133.6 lb

## 2023-10-07 DIAGNOSIS — C3432 Malignant neoplasm of lower lobe, left bronchus or lung: Secondary | ICD-10-CM | POA: Insufficient documentation

## 2023-10-07 NOTE — Progress Notes (Signed)
 Radiation Oncology Follow up Note  Name: Crystal Barrett   Date:   10/07/2023 MRN:  [id number] DOB: Apr 17, 1955    This 68 y.o. female presents to the clinic today for 1 month follow-up status post SBRT to her left lower lobe for stage I A3 (T1 cN0 M0) adenocarcinoma.  REFERRING PROVIDER: Epifanio Alm SHAUNNA, MD  HPI: Patient is a 68 year old female now out 1 month having completed SBRT to her left lower lobe for stage I A3 adenocarcinoma seen today in routine follow-up she is doing well specifically denies any cough hemoptysis chest tightness or any change in her pulmonary status.  She has not had any imaging imaging studies does have a scan ordered for September..  COMPLICATIONS OF TREATMENT: none  FOLLOW UP COMPLIANCE: keeps appointments   PHYSICAL EXAM:  BP 108/82   Pulse 79   Temp 98.4 F (36.9 C) (Tympanic)   Resp 15   Ht 5' 5 (1.651 m)   Wt 133 lb 9.6 oz (60.6 kg)   BMI 22.23 kg/m  Well-developed well-nourished patient in NAD. HEENT reveals PERLA, EOMI, discs not visualized.  Oral cavity is clear. No oral mucosal lesions are identified. Neck is clear without evidence of cervical or supraclavicular adenopathy. Lungs are clear to A&P. Cardiac examination is essentially unremarkable with regular rate and rhythm without murmur rub or thrill. Abdomen is benign with no organomegaly or masses noted. Motor sensory and DTR levels are equal and symmetric in the upper and lower extremities. Cranial nerves II through XII are grossly intact. Proprioception is intact. No peripheral adenopathy or edema is identified. No motor or sensory levels are noted. Crude visual fields are within normal range.  RADIOLOGY RESULTS: CT scan of the chest ordered for September  PLAN: Present time patient is doing well low side effect profile from SBRT.  And pleased with her overall progress.  Of asked to see her back in follow-up about a week after her CT scan in September.  Patient knows to call with any  concerns at any time.  I would like to take this opportunity to thank you for allowing me to participate in the care of your patient.SABRA Marcey Penton, MD

## 2023-12-03 ENCOUNTER — Ambulatory Visit
Admission: RE | Admit: 2023-12-03 | Discharge: 2023-12-03 | Disposition: A | Discharge status: Home / Self Care | Source: Ambulatory Visit | Attending: Oncology | Admitting: Oncology

## 2023-12-03 DIAGNOSIS — C3432 Malignant neoplasm of lower lobe, left bronchus or lung: Secondary | ICD-10-CM | POA: Insufficient documentation

## 2023-12-03 MED ORDER — IOHEXOL 300 MG/ML  SOLN
75.0000 mL | Freq: Once | INTRAMUSCULAR | Status: AC | PRN
  Administered 2023-12-03: 75 mL via INTRAVENOUS

## 2023-12-10 ENCOUNTER — Other Ambulatory Visit: Payer: Self-pay | Admitting: *Deleted

## 2023-12-10 ENCOUNTER — Encounter: Payer: Self-pay | Admitting: Radiation Oncology

## 2023-12-10 ENCOUNTER — Encounter: Payer: Self-pay | Admitting: Oncology

## 2023-12-10 ENCOUNTER — Ambulatory Visit
Admission: RE | Admit: 2023-12-10 | Discharge: 2023-12-10 | Disposition: A | Discharge status: Home / Self Care | Source: Ambulatory Visit | Attending: Radiation Oncology | Admitting: Radiation Oncology

## 2023-12-10 ENCOUNTER — Inpatient Hospital Stay: Attending: Oncology | Admitting: Oncology

## 2023-12-10 VITALS — BP 122/80 | HR 71 | Temp 97.5°F | Resp 18 | Ht 65.0 in | Wt 135.0 lb

## 2023-12-10 VITALS — BP 117/78 | HR 79 | Temp 98.3°F | Resp 16 | Wt 135.0 lb

## 2023-12-10 DIAGNOSIS — Z85118 Personal history of other malignant neoplasm of bronchus and lung: Secondary | ICD-10-CM | POA: Insufficient documentation

## 2023-12-10 DIAGNOSIS — Z923 Personal history of irradiation: Secondary | ICD-10-CM | POA: Insufficient documentation

## 2023-12-10 DIAGNOSIS — Z85038 Personal history of other malignant neoplasm of large intestine: Secondary | ICD-10-CM | POA: Insufficient documentation

## 2023-12-10 DIAGNOSIS — C3432 Malignant neoplasm of lower lobe, left bronchus or lung: Secondary | ICD-10-CM | POA: Insufficient documentation

## 2023-12-10 DIAGNOSIS — F17290 Nicotine dependence, other tobacco product, uncomplicated: Secondary | ICD-10-CM | POA: Insufficient documentation

## 2023-12-10 NOTE — Progress Notes (Signed)
 Radiation Oncology Follow up Note  Name: Crystal Barrett   Date:   12/10/2023 MRN:  [id number] DOB: 06-25-1955    This 68 y.o. female presents to the clinic today for 57-month follow-up status post SBRT to left lower lobe for stage I adenocarcinoma.  REFERRING PROVIDER: Epifanio Alm SHAUNNA, MD  HPI: Patient is a 68 year old female now at 1 month having completed SBRT to her left lower lobe for stage I A3 (T1 cN0 M0) adenocarcinoma seen today in routine follow-up she is doing well.  She specifically denies cough hemoptysis chest tightness or any change in her pulmonary status.  She had a recent CT scan.  Which I have reviewed showed previously index ground glass left lower lobe pulm nodule no longer identified there is now cystic focus in this area with linear bands favoring posttreatment.  No new suspicious pulmonary nodules or masses or evidence of metastatic disease in the chest is noted.  COMPLICATIONS OF TREATMENT: none  FOLLOW UP COMPLIANCE: keeps appointments   PHYSICAL EXAM:  BP 117/78   Pulse 79   Temp 98.3 F (36.8 C) (Tympanic)   Resp 16   Wt 135 lb (61.2 kg)   BMI 22.47 kg/m  Well-developed well-nourished patient in NAD. HEENT reveals PERLA, EOMI, discs not visualized.  Oral cavity is clear. No oral mucosal lesions are identified. Neck is clear without evidence of cervical or supraclavicular adenopathy. Lungs are clear to A&P. Cardiac examination is essentially unremarkable with regular rate and rhythm without murmur rub or thrill. Abdomen is benign with no organomegaly or masses noted. Motor sensory and DTR levels are equal and symmetric in the upper and lower extremities. Cranial nerves II through XII are grossly intact. Proprioception is intact. No peripheral adenopathy or edema is identified. No motor or sensory levels are noted. Crude visual fields are within normal range.  RADIOLOGY RESULTS: CT scan reviewed compatible with above-stated findings  PLAN: The present time  patient is doing well extremely low side effect profile from SBRT.  Excellent results by CT criteria I have asked to see her back in 6 months for follow-up with a repeat CT scan of her chest.  Patient knows to call with any concerns at any time.  I would like to take this opportunity to thank you for allowing me to participate in the care of your patient.SABRA Marcey Penton, MD

## 2023-12-10 NOTE — Progress Notes (Signed)
 Patient had a CT scan on 12/03/2023. She is doing well.

## 2023-12-10 NOTE — Progress Notes (Signed)
 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  She continues to feel well and remains asymptomatic.  She has no neurologic complaints.  She denies any recent fevers or illnesses.  She has a good appetite and denies weight loss.  She has no chest pain, shortness of breath, cough, or hemoptysis.  She denies any nausea, vomiting, constipation, or diarrhea.  She has no urinary complaints.  Patient offers no specific complaints today.  REVIEW OF SYSTEMS:   Review of Systems  Constitutional: Negative.  Negative for fever, malaise/fatigue and weight loss.  Respiratory: Negative.  Negative for cough, hemoptysis and shortness of breath.   Cardiovascular: Negative.  Negative for chest pain and leg swelling.  Gastrointestinal: Negative.  Negative for abdominal pain.  Genitourinary: Negative.  Negative for dysuria.  Musculoskeletal: Negative.  Negative for back pain.  Skin: Negative.  Negative for rash.  Neurological: Negative.  Negative for dizziness, focal weakness, weakness and headaches.  Psychiatric/Behavioral: Negative.  The patient is not nervous/anxious.     As per HPI. Otherwise, a complete review of systems is negative.  PAST MEDICAL HISTORY: Past Medical History:  Diagnosis Date   Acid reflux    Allergy    Asthma    Centrilobular emphysema (HCC)    Chicken pox    Chronic Helicobacter pylori gastritis    Colon cancer (HCC) 01/2020   Eczema     Former cigarette smoker    History of colon cancer 01/2020   Lung nodule    Osteopenia    PONV (postoperative nausea and vomiting)    Skin cancer 2024   Right arm   Vapes nicotine containing substance     PAST SURGICAL HISTORY: Past Surgical History:  Procedure Laterality Date   COLECTOMY     with ileostomy   COLONOSCOPY  2010   Diverticulitis. No polyps.    ILEOSTOMY CLOSURE     INNER EAR SURGERY     UPPER GASTROINTESTINAL ENDOSCOPY  2000   VIDEO BRONCHOSCOPY WITH ENDOBRONCHIAL NAVIGATION N/A 07/19/2023   Procedure: VIDEO BRONCHOSCOPY WITH ENDOBRONCHIAL NAVIGATION;  Surgeon: Parris Manna, MD;  Location: [facility] ORS;  Service: Thoracic;  Laterality: N/A;   VIDEO BRONCHOSCOPY WITH ENDOBRONCHIAL ULTRASOUND N/A 07/19/2023   Procedure: BRONCHOSCOPY, WITH EBUS;  Surgeon: Parris Manna, MD;  Location: [facility] ORS;  Service: Thoracic;  Laterality: N/A;   WISDOM TOOTH EXTRACTION      FAMILY HISTORY: Family History  Problem Relation Age of Onset   Heart disease Mother    Cancer Father        Brain cancer   Ovarian cancer Maternal Aunt    Diabetes Daughter    Breast cancer Neg Hx    Colon cancer Neg Hx     ADVANCED DIRECTIVES (Y/N):  N  HEALTH MAINTENANCE: Social History   Tobacco Use   Smoking status: Former    Current packs/day: 0.50    Average packs/day: 0.5 packs/day for 0.6 years (0.3 ttl pk-yrs)    Types: Cigarettes    Start date: 05/08/2023    Quit date:  06/04/2006   Smokeless tobacco: Never  Vaping Use   Vaping status: Every Day   Substances: Nicotine, Flavoring  Substance Use Topics   Alcohol use: Not Currently    Comment: Occassionally   Drug use: No     Colonoscopy:  PAP:  Bone density:  Lipid panel:  Allergies  Allergen Reactions   Codeine Nausea And Vomiting   Chlorhexidine Itching    Causes itching and burning   Other Rash    Vinyl Gloves-Flares Eczema    Current Outpatient Medications  Medication Sig Dispense Refill   azelastine (ASTELIN)  0.1 % nasal spray Place 1 spray into both nostrils as needed for allergies. Use in each nostril as directed     clobetasol cream (TEMOVATE) 0.05 % Apply 1 application  topically as needed.     Fluticasone -Umeclidin-Vilant (TRELEGY ELLIPTA) 100-62.5-25 MCG/ACT AEPB Inhale 1 puff into the lungs.     omeprazole  (PRILOSEC) 40 MG capsule Take 1 capsule (40 mg total) by mouth daily. 90 capsule 3   traZODone  (DESYREL ) 100 MG tablet Take 200 mg by mouth at bedtime.     No current facility-administered medications for this visit.    OBJECTIVE: Vitals:   12/10/23 1121  BP: 122/80  Pulse: 71  Resp: 18  Temp: (!) 97.5 F (36.4 C)  SpO2: 96%     Body mass index is 22.47 kg/m.    ECOG FS:0 - Asymptomatic  General: Well-developed, well-nourished, no acute distress. Eyes: Pink conjunctiva, anicteric sclera. HEENT: Normocephalic, moist mucous membranes. Lungs: No audible wheezing or coughing. Heart: Regular rate and rhythm. Abdomen: Soft, nontender, no obvious distention. Musculoskeletal: No edema, cyanosis, or clubbing. Neuro: Alert, answering all questions appropriately. Cranial nerves grossly intact. Skin: No rashes or petechiae noted. Psych: Normal affect.  LAB RESULTS:  Lab Results  Component Value Date   NA 135 07/17/2023   K 4.1 07/17/2023   CL 104 07/17/2023   CO2 25 07/17/2023   GLUCOSE 129 (H) 07/17/2023   BUN 13 07/17/2023   CREATININE 0.95 07/17/2023   CALCIUM 9.2 07/17/2023   PROT 6.8 09/30/2020   ALBUMIN 4.7 09/30/2020   AST 30 09/30/2020   ALT 26 09/30/2020   ALKPHOS 95 09/30/2020   BILITOT 0.4 09/30/2020   GFRNONAA >60 07/17/2023    Lab Results  Component Value Date   WBC 6.2 07/17/2023   NEUTROABS 3.2 09/30/2020   HGB 13.5 07/17/2023   HCT 40.3 07/17/2023   MCV 96.4 07/17/2023   PLT 255 07/17/2023     STUDIES: CT CHEST W CONTRAST Result Date: 12/05/2023 CLINICAL DATA:  History of lung cancer, follow-up. * Tracking Code: BO * EXAM: CT CHEST WITH  CONTRAST TECHNIQUE: Multidetector CT imaging of the chest was performed during intravenous contrast administration. RADIATION DOSE REDUCTION: This exam was performed according to the departmental dose-optimization program which includes automated exposure control, adjustment of the mA and/or kV according to patient size and/or use of iterative reconstruction technique. CONTRAST:  75mL OMNIPAQUE  IOHEXOL  300 MG/ML  SOLN COMPARISON:  PET-CT Aug 06, 2023 and chest CT July 18, 2023. FINDINGS: Cardiovascular: Aortic atherosclerosis. Normal caliber thoracic aorta. Coronary artery calcifications. Normal size heart. Trace pericardial effusion. Mediastinum/Nodes: No suspicious thyroid  nodule. No pathologically enlarged mediastinal, hilar or axillary lymph nodes. Lungs/Pleura: Previously indexed ground-glass left lower lobe pulmonary nodule with biopsy clip in place is no longer identified there is a new cystic focus within this area measuring 12 x 9 mm on image 76/5. Linear bands of opacification within the left  lower lobe extend from the treatment bed. No new suspicious pulmonary nodules or masses. Biapical pleuroparenchymal scarring. Scattered atelectasis/scarring. Upper Abdomen: No suspicious adrenal nodule/mass.  No acute finding. Musculoskeletal: No aggressive lytic or blastic lesion of bone. Remote T7 compression deformity. IMPRESSION: 1. Previously indexed ground-glass left lower lobe pulmonary nodule with biopsy clip in place is no longer identified there is a new cystic focus within this area measuring 12 x 9 mm, with linear bands of opacification extending from this area favored to reflect posttreatment change. 2. No new suspicious pulmonary nodules or masses. 3. No evidence of metastatic disease in the chest. 4. Aortic atherosclerosis. Aortic Atherosclerosis (ICD10-I70.0). Electronically Signed   By: Reyes Holder M.D.   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  PET scan completed on Aug 06, 2023 reviewed independently with no significant hypermetabolic activity.  Patient completed SBRT on September 02, 2023.  Repeat CT scan on December 05, 2023 reviewed independently and report as above with posttreatment changes, but no other significant pathology.  No further interventions needed.  Return to clinic in 6 months with repeat imaging and further evaluation.  I spent a total of 20 minutes reviewing chart data, face-to-face evaluation with the patient, counseling and coordination of care as detailed above.    Patient expressed understanding and was in agreement with this plan. She also understands that She can call clinic at any time with any questions, concerns, or complaints.    Cancer Staging  Adenocarcinoma of lower lobe of left lung [facility]) Staging form: Lung, AJCC V9 - Clinical stage from 07/29/2023: Stage IA3 (cT1c, cN0, cM0) - Signed by Jacobo Evalene PARAS, MD on 07/29/2023 Stage prefix: Initial diagnosis   Evalene PARAS Jacobo, MD   12/10/2023 11:31 AM

## 2023-12-16 ENCOUNTER — Encounter: Payer: Self-pay | Admitting: Oncology

## 2024-02-18 ENCOUNTER — Other Ambulatory Visit: Payer: Self-pay | Admitting: Infectious Diseases

## 2024-02-18 ENCOUNTER — Other Ambulatory Visit: Payer: Self-pay | Admitting: *Deleted

## 2024-02-18 DIAGNOSIS — Z1231 Encounter for screening mammogram for malignant neoplasm of breast: Secondary | ICD-10-CM

## 2024-02-19 ENCOUNTER — Other Ambulatory Visit: Payer: Self-pay | Admitting: Infectious Diseases

## 2024-02-19 DIAGNOSIS — Z85038 Personal history of other malignant neoplasm of large intestine: Secondary | ICD-10-CM

## 2024-02-19 DIAGNOSIS — M8580 Other specified disorders of bone density and structure, unspecified site: Secondary | ICD-10-CM

## 2024-02-19 DIAGNOSIS — K219 Gastro-esophageal reflux disease without esophagitis: Secondary | ICD-10-CM

## 2024-02-19 DIAGNOSIS — Z1331 Encounter for screening for depression: Secondary | ICD-10-CM

## 2024-02-19 DIAGNOSIS — C3492 Malignant neoplasm of unspecified part of left bronchus or lung: Secondary | ICD-10-CM

## 2024-02-19 DIAGNOSIS — R9430 Abnormal result of cardiovascular function study, unspecified: Secondary | ICD-10-CM

## 2024-03-03 ENCOUNTER — Ambulatory Visit
Admission: RE | Admit: 2024-03-03 | Discharge: 2024-03-03 | Disposition: A | Payer: Self-pay | Source: Ambulatory Visit | Attending: Infectious Diseases | Admitting: Infectious Diseases

## 2024-03-03 DIAGNOSIS — R9430 Abnormal result of cardiovascular function study, unspecified: Secondary | ICD-10-CM | POA: Insufficient documentation

## 2024-03-03 DIAGNOSIS — K219 Gastro-esophageal reflux disease without esophagitis: Secondary | ICD-10-CM | POA: Insufficient documentation

## 2024-03-03 DIAGNOSIS — M8580 Other specified disorders of bone density and structure, unspecified site: Secondary | ICD-10-CM | POA: Insufficient documentation

## 2024-03-03 DIAGNOSIS — Z85038 Personal history of other malignant neoplasm of large intestine: Secondary | ICD-10-CM | POA: Insufficient documentation

## 2024-03-03 DIAGNOSIS — C3492 Malignant neoplasm of unspecified part of left bronchus or lung: Secondary | ICD-10-CM | POA: Insufficient documentation

## 2024-03-03 DIAGNOSIS — Z1331 Encounter for screening for depression: Secondary | ICD-10-CM | POA: Insufficient documentation

## 2024-03-24 ENCOUNTER — Encounter

## 2024-04-14 ENCOUNTER — Ambulatory Visit
Admission: RE | Admit: 2024-04-14 | Discharge: 2024-04-14 | Disposition: A | Discharge status: Home / Self Care | Source: Ambulatory Visit | Attending: Infectious Diseases | Admitting: Infectious Diseases

## 2024-04-14 DIAGNOSIS — Z1231 Encounter for screening mammogram for malignant neoplasm of breast: Secondary | ICD-10-CM | POA: Diagnosis present

## 2024-04-16 ENCOUNTER — Telehealth: Payer: Self-pay | Admitting: Radiation Oncology

## 2024-04-16 NOTE — Telephone Encounter (Signed)
 Pt called to r/s Crystal Barrett follow up appt to same-day as Crystal Barrett appt so it's less trips back to the hospital - pt said she didn't need a call back - will see appt changes in mychart [facility]

## 2024-06-01 ENCOUNTER — Other Ambulatory Visit

## 2024-06-08 ENCOUNTER — Ambulatory Visit

## 2024-06-15 ENCOUNTER — Ambulatory Visit: Admitting: Oncology

## 2024-06-15 ENCOUNTER — Ambulatory Visit: Admitting: Radiation Oncology

## 2024-06-16 ENCOUNTER — Ambulatory Visit: Admitting: Radiation Oncology

## 2024-06-16 ENCOUNTER — Inpatient Hospital Stay: Admitting: Oncology
